# Patient Record
Sex: Male | Born: 1970 | Hispanic: No | Marital: Married | Smoking: Never smoker
Health system: Southern US, Community
[De-identification: ages and names within clinical notes are randomized; demographics above are authoritative.]

## PROBLEM LIST (undated history)

## (undated) DIAGNOSIS — T7840XA Allergy, unspecified, initial encounter: Secondary | ICD-10-CM

## (undated) DIAGNOSIS — S62509A Fracture of unspecified phalanx of unspecified thumb, initial encounter for closed fracture: Secondary | ICD-10-CM

## (undated) HISTORY — PX: TOOTH EXTRACTION: SUR596

## (undated) HISTORY — PX: COLONOSCOPY: SHX5424

## (undated) HISTORY — DX: Allergy, unspecified, initial encounter: T78.40XA

---

## 1998-03-10 ENCOUNTER — Emergency Department (HOSPITAL_COMMUNITY): Admission: EM | Admit: 1998-03-10 | Discharge: 1998-03-10 | Payer: Self-pay | Admitting: Emergency Medicine

## 1999-07-14 ENCOUNTER — Emergency Department (HOSPITAL_COMMUNITY): Admission: EM | Admit: 1999-07-14 | Discharge: 1999-07-14 | Payer: Self-pay | Admitting: Podiatry

## 2000-04-25 ENCOUNTER — Emergency Department (HOSPITAL_COMMUNITY): Admission: EM | Admit: 2000-04-25 | Discharge: 2000-04-25 | Payer: Self-pay | Admitting: Emergency Medicine

## 2002-07-07 ENCOUNTER — Ambulatory Visit (HOSPITAL_BASED_OUTPATIENT_CLINIC_OR_DEPARTMENT_OTHER): Admission: RE | Admit: 2002-07-07 | Discharge: 2002-07-07 | Payer: Self-pay | Admitting: General Surgery

## 2012-05-31 ENCOUNTER — Encounter: Payer: Self-pay | Admitting: Internal Medicine

## 2012-05-31 ENCOUNTER — Ambulatory Visit (INDEPENDENT_AMBULATORY_CARE_PROVIDER_SITE_OTHER): Payer: 59 | Admitting: Internal Medicine

## 2012-05-31 ENCOUNTER — Ambulatory Visit: Payer: Self-pay | Admitting: Internal Medicine

## 2012-05-31 VITALS — BP 110/74 | HR 86 | Temp 98.2°F | Resp 18 | Ht 71.25 in | Wt 198.0 lb

## 2012-05-31 DIAGNOSIS — Z0000 Encounter for general adult medical examination without abnormal findings: Secondary | ICD-10-CM

## 2012-05-31 NOTE — Progress Notes (Signed)
  Subjective:    Patient ID: Todd Pham, male    DOB: 02-15-1971, 41 y.o.   MRN: [id number]  HPI 41 year-old patient who is seen today to establish with our practice. He has enjoyed excellent health and has no concerns or complaints. He has a mild seasonal allergy issues but takes no chronic medications. Past medical history is unremarkable. No hospitalizations or illnesses. He takes no chronic medications. No surgeries. He did sustain a fracture to his arm in the past  Family history noncontributory pressure and there early 60s and in good health one brother one sister likewise in good health Social history nonsmoker up in [place] has been a resident of [place] since 1999. [employer] graduate in Primary school teacher    Review of Systems  Constitutional: Negative for fever, chills, activity change, appetite change and fatigue.  HENT: Negative for hearing loss, ear pain, congestion, rhinorrhea, sneezing, mouth sores, trouble swallowing, neck pain, neck stiffness, dental problem, voice change, sinus pressure and tinnitus.   Eyes: Negative for photophobia, pain, redness and visual disturbance.  Respiratory: Negative for apnea, cough, choking, chest tightness, shortness of breath and wheezing.   Cardiovascular: Negative for chest pain, palpitations and leg swelling.  Gastrointestinal: Negative for nausea, vomiting, abdominal pain, diarrhea, constipation, blood in stool, abdominal distention, anal bleeding and rectal pain.  Genitourinary: Negative for dysuria, urgency, frequency, hematuria, flank pain, decreased urine volume, discharge, penile swelling, scrotal swelling, difficulty urinating, genital sores and testicular pain.  Musculoskeletal: Negative for myalgias, back pain, joint swelling, arthralgias and gait problem.  Skin: Negative for color change, rash and wound.  Neurological: Negative for dizziness, tremors, seizures, syncope, facial asymmetry, speech difficulty, weakness, light-headedness,  numbness and headaches.  Hematological: Negative for adenopathy. Does not bruise/bleed easily.  Psychiatric/Behavioral: Negative for suicidal ideas, hallucinations, behavioral problems, confusion, disturbed wake/sleep cycle, self-injury, dysphoric mood, decreased concentration and agitation. The patient is not nervous/anxious.        Objective:   Physical Exam  Constitutional: He appears well-developed and well-nourished.  HENT:  Head: Normocephalic and atraumatic.  Right Ear: External ear normal.  Left Ear: External ear normal.  Nose: Nose normal.  Mouth/Throat: Oropharynx is clear and moist.  Eyes: Conjunctivae normal and EOM are normal. Pupils are equal, round, and reactive to light. No scleral icterus.  Neck: Normal range of motion. Neck supple. No JVD present. No thyromegaly present.  Cardiovascular: Regular rhythm, normal heart sounds and intact distal pulses.  Exam reveals no gallop and no friction rub.   No murmur heard. Pulmonary/Chest: Effort normal and breath sounds normal. He exhibits no tenderness.  Abdominal: Soft. Bowel sounds are normal. He exhibits no distension and no mass. There is no tenderness.  Genitourinary: Penis normal.  Musculoskeletal: Normal range of motion. He exhibits no edema and no tenderness.  Lymphadenopathy:    He has no cervical adenopathy.  Neurological: He is alert. He has normal reflexes. No cranial nerve deficit. Coordination normal.  Skin: Skin is warm and dry. No rash noted.  Psychiatric: He has a normal mood and affect. His behavior is normal.          Assessment & Plan:   Preventive health exam . More rigorous exercise regimen modest weight loss all encouraged  Return here in one or 2 years

## 2012-05-31 NOTE — Patient Instructions (Signed)
It is important that you exercise regularly, at least 20 minutes 3 to 4 times per week.  If you develop chest pain or shortness of breath seek  medical attention.   

## 2014-01-31 ENCOUNTER — Telehealth: Payer: Self-pay | Admitting: Internal Medicine

## 2014-01-31 NOTE — Telephone Encounter (Signed)
Dr. Raliegh Ip, please see message and advise whether we can schedule appointment.

## 2014-01-31 NOTE — Telephone Encounter (Signed)
Pt called saying he saw Dr Raliegh Ip back in 2013 we can find no record of this we are showing he cancel this  appt . He is very adamant about being seen here and wants an appointment with Dr. Raliegh Ip  We offered him someone else but he refuses saying he is a patient of Dr Raliegh Ip

## 2014-02-01 ENCOUNTER — Ambulatory Visit (INDEPENDENT_AMBULATORY_CARE_PROVIDER_SITE_OTHER): Payer: PRIVATE HEALTH INSURANCE | Admitting: Family Medicine

## 2014-02-01 ENCOUNTER — Encounter: Payer: Self-pay | Admitting: Family Medicine

## 2014-02-01 VITALS — BP 143/87 | HR 73 | Ht 71.0 in | Wt 204.0 lb

## 2014-02-01 DIAGNOSIS — IMO0001 Reserved for inherently not codable concepts without codable children: Secondary | ICD-10-CM

## 2014-02-01 DIAGNOSIS — Z8781 Personal history of (healed) traumatic fracture: Secondary | ICD-10-CM | POA: Insufficient documentation

## 2014-02-01 DIAGNOSIS — R030 Elevated blood-pressure reading, without diagnosis of hypertension: Secondary | ICD-10-CM

## 2014-02-01 DIAGNOSIS — Z13220 Encounter for screening for lipoid disorders: Secondary | ICD-10-CM

## 2014-02-01 DIAGNOSIS — Z0000 Encounter for general adult medical examination without abnormal findings: Secondary | ICD-10-CM

## 2014-02-01 DIAGNOSIS — Z131 Encounter for screening for diabetes mellitus: Secondary | ICD-10-CM

## 2014-02-01 LAB — POCT URINALYSIS DIPSTICK
Bilirubin, UA: NEGATIVE
Glucose, UA: NEGATIVE
Ketones, UA: NEGATIVE
Leukocytes, UA: NEGATIVE
Nitrite, UA: NEGATIVE
Protein, UA: NEGATIVE
RBC UA: NEGATIVE
Spec Grav, UA: 1.01
UROBILINOGEN UA: 0.2
pH, UA: 7

## 2014-02-01 NOTE — Progress Notes (Signed)
CC: Todd Pham is a 43 y.o. male is here for Establish Care   Subjective: HPI:  Colonoscopy: No family history of colon cancer will begin screening at age 68 Prostate: Discussed screening risks/beneifts with patient today, no family history of prostate cancer we'll consider screening age 37  Influenza Vaccine: No current indication however advised to get this during this flu season Pneumovax: No current indication Td/Tdap: Up to date with tetanus booster 2007/2008 Zoster: (Start 41 yo)  Very pleasant 43 year old here to establish care requesting CPE. His only complaint is occasional burning with urination. He has not been sexually active for 9 years. Burning is described only as burning, mild in severity, unpredictable, occasional cloudy haze to his urine. He denies penile discharge, penile lesions, nor testicular pain  Rare alcohol use no tobacco or recreational drug use.  Review of Systems - General ROS: negative for - chills, fever, night sweats, weight gain or weight loss Ophthalmic ROS: negative for - decreased vision Psychological ROS: negative for - anxiety or depression ENT ROS: negative for - hearing change, nasal congestion, tinnitus or allergies Hematological and Lymphatic ROS: negative for - bleeding problems, bruising or swollen lymph nodes Breast ROS: negative Respiratory ROS: no cough, shortness of breath, or wheezing Cardiovascular ROS: no chest pain or dyspnea on exertion Gastrointestinal ROS: no abdominal pain, change in bowel habits, or black or bloody stools Genito-Urinary ROS: negative for - genital discharge, genital ulcers, incontinence or abnormal bleeding from genitals Musculoskeletal ROS: negative for - joint pain or muscle pain Neurological ROS: negative for - headaches or memory loss Dermatological ROS: negative for lumps, mole changes, rash and skin lesion changes  Past Medical History  Diagnosis Date  . Allergy     History reviewed. No pertinent past  surgical history. Family History  Problem Relation Age of Onset  . Skin cancer      grandfather    History   Social History  . Marital Status: Single    Spouse Name: N/A    Number of Children: N/A  . Years of Education: N/A   Occupational History  . Not on file.   Social History Main Topics  . Smoking status: Never Smoker   . Smokeless tobacco: Never Used  . Alcohol Use: Yes  . Drug Use: No  . Sexual Activity: No   Other Topics Concern  . Not on file   Social History Narrative  . No narrative on file     Objective: BP 143/87  Pulse 73  Ht 5\' 11"  (1.803 m)  Wt 204 lb (92.534 kg)  BMI 28.46 kg/m2  General: No Acute Distress HEENT: Atraumatic, normocephalic, conjunctivae normal without scleral icterus.  No nasal discharge, hearing grossly intact, TMs with good landmarks bilaterally with no middle ear abnormalities, posterior pharynx clear without oral lesions. Neck: Supple, trachea midline, no cervical nor supraclavicular adenopathy. Pulmonary: Clear to auscultation bilaterally without wheezing, rhonchi, nor rales. Cardiac: Regular rate and rhythm.  No murmurs, rubs, nor gallops. No peripheral edema.  2+ peripheral pulses bilaterally. Abdomen: Bowel sounds normal.  No masses.  Non-tender without rebound.  Negative Murphy's sign. GU: Bilateral descended testes no inguinal hernia  MSK: Grossly intact, no signs of weakness.  Full strength throughout upper and lower extremities.  Full ROM in upper and lower extremities.  No midline spinal tenderness. He has approximately 5-10 of angulation in the left humerus deviating anteriorly approximately at the mid humeral shaft Neuro: Gait unremarkable, CN II-XII grossly intact.  C5-C6 Reflex 2/4 Bilaterally,  L4 Reflex 2/4 Bilaterally.  Cerebellar function intact. Skin: No rashes other than cherry angiomas on the forehead and back and chest Psych: Alert and oriented to person/place/time.  Thought process normal. No  anxiety/depression. Assessment & Plan: Todd Pham was seen today for establish care.  Diagnoses and associated orders for this visit:  Diabetes mellitus screening - Basic Metabolic Panel (BMET) - Urinalysis Dipstick  Elevated blood pressure - Basic Metabolic Panel (BMET)  Annual physical exam  Lipid screening - Lipid panel  History of humerus fracture    Offered routine STD panel most importantly GC and Chlamydia probe given his urinary symptoms but also blood work with HIV, syphilis, hepatitis C testing. He politely declines offer for to only provide a urinalysis to rule out UTI understanding this would not include gonorrhea or Chlamydia identification Healthy lifestyle interventions including but not limited to regular exercise, a healthy low fat diet, moderation of salt intake, the dangers of tobacco/alcohol/recreational drug use, nutrition supplementation, and accident avoidance were discussed with the patient and a handout was provided for future reference. Due for routine dyslipidemia and diabetic screening  Urinalysis was normal   Return in about 3 months (around 05/04/2014) for Blood Pressure Re-Check.

## 2014-02-01 NOTE — Patient Instructions (Signed)
Dr. Lajoyce Lauber General Advice Following Your Complete Physical Exam  The Benefits of Regular Exercise: Unless you suffer from an uncontrolled cardiovascular condition, studies strongly suggest that regular exercise and physical activity will add to both the quality and length of your life.  The World Health Organization recommends 150 minutes of moderate intensity aerobic activity every week.  This is best split over 3-4 days a week, and can be as simple as a brisk walk for just over 35 minutes "most days of the week".  This type of exercise has been shown to lower LDL-Cholesterol, lower average blood sugars, lower blood pressure, lower cardiovascular disease risk, improve memory, and increase one's overall sense of wellbeing.  The addition of anaerobic (or "strength training") exercises offers additional benefits including but not limited to increased metabolism, prevention of osteoporosis, and improved overall cholesterol levels.  How Can I Strive For A Low-Fat Diet?: Current guidelines recommend that 25-35 percent of your daily energy (food) intake should come from fats.  One might ask how can this be achieved without having to dissect each meal on a daily basis?  Switch to skim or 1% milk instead of whole milk.  Focus on lean meats such as ground [place], fresh fish, baked chicken, and lean cuts of beef as your source of dietary protein.  Limit saturated fat consumption to less than 10% of your daily caloric intake.  Limit trans fatty acid consumption primarily by limiting synthetic trans fats such as partially hydrogenated oils (Ex: fried fast foods).  Substitute olive or vegetable oil for solid fats where possible.  Moderation of Salt Intake: Provided you don't carry a diagnosis of congestive heart failure nor renal failure, I recommend a daily allowance of no more than 2300 mg of salt (sodium).  Keeping under this daily goal is associated with a decreased risk of cardiovascular events, creeping  above it can lead to elevated blood pressures and increases your risk of cardiovascular events.  Milligrams (mg) of salt is listed on all nutrition labels, and your daily intake can add up faster than you think.  Most canned and frozen dinners can pack in over half your daily salt allowance in one meal.    Lifestyle Health Risks: Certain lifestyle choices carry specific health risks.  As you may already know, tobacco use has been associated with increasing one's risk of cardiovascular disease, pulmonary disease, numerous cancers, among many other issues.  What you may not know is that there are medications and nicotine replacement strategies that can more than double your chances of successfully quitting.  I would be thrilled to help manage your quitting strategy if you currently use tobacco products.  When it comes to alcohol use, I've yet to find an "ideal" daily allowance.  Provided an individual does not have a medical condition that is exacerbated by alcohol consumption, general guidelines determine "safe drinking" as no more than two standard drinks for a man or no more than one standard drink for a male per day.  However, much debate still exists on whether any amount of alcohol consumption is technically "safe".  My general advice, keep alcohol consumption to a minimum for general health promotion.  If you or others believe that alcohol, tobacco, or recreational drug use is interfering with your life, I would be happy to provide confidential counseling regarding treatment options.  General "Over The Counter" Nutrition Advice: Postmenopausal women should aim for a daily calcium intake of 1200 mg, however a significant portion of this might already be  provided by diets including milk, yogurt, cheese, and other dairy products.  Vitamin D has been shown to help preserve bone density, prevent fatigue, and has even been shown to help reduce falls in the elderly.  Ensuring a daily intake of 800 Units of  Vitamin D is a good place to start to enjoy the above benefits, we can easily check your Vitamin D level to see if you'd potentially benefit from supplementation beyond 800 Units a day.  Folic Acid intake should be of particular concern to women of childbearing age.  Daily consumption of 007-622 mcg of Folic Acid is recommended to minimize the chance of spinal cord defects in a fetus should pregnancy occur.    For many adults, accidents still remain one of the most common culprits when it comes to cause of death.  Some of the simplest but most effective preventitive habits you can adopt include regular seatbelt use, proper helmet use, securing firearms, and regularly testing your smoke and carbon monoxide detectors.  Sean B. [place] [address] [place], [place], [place] [address] Phone: [phone]  Testicular Self-Exam A self-examination of your testicles involves looking at and feeling your testicles for abnormal lumps or swelling. Several things can cause swelling, lumps, or pain in your testicles. Some of these causes are:  Injuries.  Inflammation.  Infection.  Accumulation of fluids around your testicle (hydrocele).  Twisted testicles (testicular torsion).  Testicular cancer. Self-examination of the testicles and groin areas may be advised if you are at risk for testicular cancer. Risks for testicular cancer include:  An undescended testicle (cryptorchidism).  A history of previous testicular cancer.  A family history of testicular cancer. The testicles are easiest to examine after warm baths or showers and are more difficult to examine when you are cold. This is because the muscles attached to the testicles retract and pull them up higher or into the abdomen. Follow these steps while you are standing:  Hold your penis away from your body.  Roll one testicle between your thumb and forefinger, feeling the entire testicle.  Roll the other  testicle between your thumb and forefinger, feeling the entire testicle. Feel for lumps, swelling, or discomfort. A normal testicle is egg shaped and feels firm. It is smooth and not tender. The spermatic cord can be felt as a firm spaghetti-like cord at the back of your testicle. It is also important to examine the crease between the front of your leg and your abdomen. Feel for any bumps that are tender. These could be enlarged lymph nodes.  Document Released: 11/10/2000 Document Revised: 04/06/2013 Document Reviewed: 01/24/2013 [facility] Patient Information 2015 [place], [place]. This information is not intended to replace advice given to you by your health care Valisha Heslin. Make sure you discuss any questions you have with your health care Olivya Sobol.

## 2014-02-03 LAB — BASIC METABOLIC PANEL
BUN: 9 mg/dL (ref 6–23)
CO2: 27 mEq/L (ref 19–32)
CREATININE: 0.71 mg/dL (ref 0.50–1.35)
Calcium: 9.4 mg/dL (ref 8.4–10.5)
Chloride: 103 mEq/L (ref 96–112)
Glucose, Bld: 83 mg/dL (ref 70–99)
Potassium: 4.3 mEq/L (ref 3.5–5.3)
Sodium: 140 mEq/L (ref 135–145)

## 2014-02-03 LAB — LIPID PANEL
CHOLESTEROL: 165 mg/dL (ref 0–200)
HDL: 53 mg/dL (ref >39)
LDL Cholesterol: 97 mg/dL (ref 0–99)
TRIGLYCERIDES: 74 mg/dL (ref <150)
Total CHOL/HDL Ratio: 3.1 Ratio
VLDL: 15 mg/dL (ref 0–40)

## 2014-05-05 ENCOUNTER — Ambulatory Visit: Payer: PRIVATE HEALTH INSURANCE | Admitting: Family Medicine

## 2014-05-12 ENCOUNTER — Encounter: Payer: Self-pay | Admitting: Family Medicine

## 2014-05-12 ENCOUNTER — Ambulatory Visit (INDEPENDENT_AMBULATORY_CARE_PROVIDER_SITE_OTHER): Payer: PRIVATE HEALTH INSURANCE | Admitting: Family Medicine

## 2014-05-12 VITALS — BP 137/90 | HR 66 | Wt 206.0 lb

## 2014-05-12 DIAGNOSIS — M25562 Pain in left knee: Secondary | ICD-10-CM

## 2014-05-12 DIAGNOSIS — M25569 Pain in unspecified knee: Secondary | ICD-10-CM

## 2014-05-12 DIAGNOSIS — I10 Essential (primary) hypertension: Secondary | ICD-10-CM | POA: Insufficient documentation

## 2014-05-12 NOTE — Patient Instructions (Signed)
DASH Eating Plan °DASH stands for "Dietary Approaches to Stop Hypertension." The DASH eating plan is a healthy eating plan that has been shown to reduce high blood pressure (hypertension). Additional health benefits may include reducing the risk of type 2 diabetes mellitus, heart disease, and stroke. The DASH eating plan may also help with weight loss. °WHAT DO I NEED TO KNOW ABOUT THE DASH EATING PLAN? °For the DASH eating plan, you will follow these general guidelines: °· Choose foods with a percent daily value for sodium of less than 5% (as listed on the food label). °· Use salt-free seasonings or herbs instead of table salt or sea salt. °· Check with your health care provider or pharmacist before using salt substitutes. °· Eat lower-sodium products, often labeled as "lower sodium" or "no salt added." °· Eat fresh foods. °· Eat more vegetables, fruits, and low-fat dairy products. °· Choose whole grains. Look for the word "whole" as the first word in the ingredient list. °· Choose fish and skinless chicken or turkey more often than red meat. Limit fish, poultry, and meat to 6 oz (170 g) each day. °· Limit sweets, desserts, sugars, and sugary drinks. °· Choose heart-healthy fats. °· Limit cheese to 1 oz (28 g) per day. °· Eat more home-cooked food and less restaurant, buffet, and fast food. °· Limit fried foods. °· Cook foods using methods other than frying. °· Limit canned vegetables. If you do use them, rinse them well to decrease the sodium. °· When eating at a restaurant, ask that your food be prepared with less salt, or no salt if possible. °WHAT FOODS CAN I EAT? °Seek help from a dietitian for individual calorie needs. °Grains °Whole grain or whole wheat bread. Brown rice. Whole grain or whole wheat pasta. Quinoa, bulgur, and whole grain cereals. Low-sodium cereals. Corn or whole wheat flour tortillas. Whole grain cornbread. Whole grain crackers. Low-sodium crackers. °Vegetables °Fresh or frozen vegetables  (raw, steamed, roasted, or grilled). Low-sodium or reduced-sodium tomato and vegetable juices. Low-sodium or reduced-sodium tomato sauce and paste. Low-sodium or reduced-sodium canned vegetables.  °Fruits °All fresh, canned (in natural juice), or frozen fruits. °Meat and Other Protein Products °Ground beef (85% or leaner), grass-fed beef, or beef trimmed of fat. Skinless chicken or turkey. Ground chicken or turkey. Pork trimmed of fat. All fish and seafood. Eggs. Dried beans, peas, or lentils. Unsalted nuts and seeds. Unsalted canned beans. °Dairy °Low-fat dairy products, such as skim or 1% milk, 2% or reduced-fat cheeses, low-fat ricotta or cottage cheese, or plain low-fat yogurt. Low-sodium or reduced-sodium cheeses. °Fats and Oils °Tub margarines without trans fats. Light or reduced-fat mayonnaise and salad dressings (reduced sodium). Avocado. Safflower, olive, or canola oils. Natural peanut or almond butter. °Other °Unsalted popcorn and pretzels. °The items listed above may not be a complete list of recommended foods or beverages. Contact your dietitian for more options. °WHAT FOODS ARE NOT RECOMMENDED? °Grains °White bread. White pasta. White rice. Refined cornbread. Bagels and croissants. Crackers that contain trans fat. °Vegetables °Creamed or fried vegetables. Vegetables in a cheese sauce. Regular canned vegetables. Regular canned tomato sauce and paste. Regular tomato and vegetable juices. °Fruits °Dried fruits. Canned fruit in light or heavy syrup. Fruit juice. °Meat and Other Protein Products °Fatty cuts of meat. Ribs, chicken wings, bacon, sausage, bologna, salami, chitterlings, fatback, hot dogs, bratwurst, and packaged luncheon meats. Salted nuts and seeds. Canned beans with salt. °Dairy °Whole or 2% milk, cream, half-and-half, and cream cheese. Whole-fat or sweetened yogurt. Full-fat   cheeses or blue cheese. Nondairy creamers and whipped toppings. Processed cheese, cheese spreads, or cheese  curds. °Condiments °Onion and garlic salt, seasoned salt, table salt, and sea salt. Canned and packaged gravies. Worcestershire sauce. Tartar sauce. Barbecue sauce. Teriyaki sauce. Soy sauce, including reduced sodium. Steak sauce. Fish sauce. Oyster sauce. Cocktail sauce. Horseradish. Ketchup and mustard. Meat flavorings and tenderizers. Bouillon cubes. Hot sauce. Tabasco sauce. Marinades. Taco seasonings. Relishes. °Fats and Oils °Butter, stick margarine, lard, shortening, ghee, and bacon fat. Coconut, palm kernel, or palm oils. Regular salad dressings. °Other °Pickles and olives. Salted popcorn and pretzels. °The items listed above may not be a complete list of foods and beverages to avoid. Contact your dietitian for more information. °WHERE CAN I FIND MORE INFORMATION? °National Heart, Lung, and [employer] Institute: www.nhlbi.nih.gov/health/health-topics/topics/dash/ °Document Released: 07/24/2011 Document Revised: 12/19/2013 Document Reviewed: 06/08/2013 °[employer]® Patient Information ©2015 [employer]. This information is not intended to replace advice given to you by your health care provider. Make sure you discuss any questions you have with your health care provider. ° °

## 2014-05-12 NOTE — Progress Notes (Signed)
CC: Todd Pham is a 43 y.o. male is here for Hypertension   Subjective: HPI:  Complaint of left knee pain that has been present off and on for matter of months. It seems to be worse after mountain biking  Or any strenuous activity. It is localized deep in the joint and nonradiating. It is worse after activity and only mild during activity. Is described only as a pain. He denies any recent or remote catching locking or giving way nor swelling or overlying skin changes. He denies any recent or remote trauma. No interventions as of yet  Followup essential hypertension: Exercising approximately once a week, tries to be cognizant of sodium intake. Does not add salt any food. Shortness of breath orthopnea peripheral edema nor motor or sensory disturbances. No outside blood pressures to report  Review Of Systems Outlined In HPI  Past Medical History  Diagnosis Date  . Allergy     No past surgical history on file. Family History  Problem Relation Age of Onset  . Skin cancer      grandfather    History   Social History  . Marital Status: Single    Spouse Name: N/A    Number of Children: N/A  . Years of Education: N/A   Occupational History  . Not on file.   Social History Main Topics  . Smoking status: Never Smoker   . Smokeless tobacco: Never Used  . Alcohol Use: Yes  . Drug Use: No  . Sexual Activity: No   Other Topics Concern  . Not on file   Social History Narrative  . No narrative on file     Objective: BP 137/90  Pulse 66  Wt 206 lb (93.441 kg)  General: Alert and Oriented, No Acute Distress HEENT: Pupils equal, round, reactive to light. Conjunctivae clear.  Moist membranes pharynx unremarkable Lungs: Clear to auscultation bilaterally, no wheezing/ronchi/rales.  Comfortable work of breathing. Good air movement. Cardiac: Regular rate and rhythm. Normal S1/S2.  No murmurs, rubs, nor gallops.   Abdomen: Normal bowel sounds, soft and non tender without palpable  masses. Left knee exam shows full-strength and range of motion. There is no swelling, redness, nor warmth overlying the knee.  No patellar crepitus. No patellar apprehension. No pain with palpation of the inferior patellar pole.  No pain or laxity with valgus nor varus stress. Anterior drawer is negative. McMurray's negative. No popliteal space tenderness or palpable mass. No medial or lateral joint line tenderness to palpation.Extremities: No peripheral edema.  Strong peripheral pulses.  Mental Status: No depression, anxiety, nor agitation. Skin: Warm and dry.  Assessment & Plan: Todd Pham was seen today for hypertension.  Diagnoses and associated orders for this visit:  Left knee pain  Elevated blood pressure    Left knee pain: Suspect osteoarthritis. He was given a handout for home rehabilitation along with a resistance band to be performed on a daily basis for the next 3-4 weeks. If no benefit afterwards please followup with Dr. Darene Lamer. for sports medicine consult Essential hypertension: Formal diagnosis given today, uncontrolled I encouraged him to start on a blood pressure lowering medication however he politely declines. Discussed that This can be improved with engaging in 30-45 minutes of moderate exercise most days of the week. Discussed the Dash diet and provided with a handout   Return in about 3 months (around 08/11/2014) for BP.

## 2014-11-30 ENCOUNTER — Encounter: Payer: Self-pay | Admitting: Family Medicine

## 2014-11-30 ENCOUNTER — Ambulatory Visit (INDEPENDENT_AMBULATORY_CARE_PROVIDER_SITE_OTHER): Payer: PRIVATE HEALTH INSURANCE | Admitting: Family Medicine

## 2014-11-30 VITALS — BP 135/82 | HR 70 | Ht 71.0 in | Wt 205.0 lb

## 2014-11-30 DIAGNOSIS — M5030 Other cervical disc degeneration, unspecified cervical region: Secondary | ICD-10-CM | POA: Diagnosis not present

## 2014-11-30 DIAGNOSIS — I10 Essential (primary) hypertension: Secondary | ICD-10-CM | POA: Diagnosis not present

## 2014-11-30 DIAGNOSIS — R0982 Postnasal drip: Secondary | ICD-10-CM | POA: Diagnosis not present

## 2014-11-30 MED ORDER — PREDNISONE 20 MG PO TABS: ORAL_TABLET | ORAL | Status: AC

## 2014-11-30 NOTE — Progress Notes (Signed)
CC: Todd Pham is a 44 y.o. male is here for Follow-up   Subjective: HPI:  Follow-up essential hypertension: No outside blood pressures report. He is trying to stay physically active with mountain biking. [place] keep sodium intake to a minimum. No chest pain shortness of breath orthopnea peripheral edema nor motor or sensory disturbances other than that described below.  Since I saw him last he was seen at a orthopedic clinic and was found to have degenerative disc disease in the cervical spine. They have proposed that he get treatment with some sort of table that he would lie on that vibrates to get "fluid back into the discs". They've also proposed that he gets steroid injections in the cervical spine. He tells me he has no neck pain but does have left shoulder and upper proximal extremity discomfort that he has difficulty describing but has no positional component to the pain and no overlying skin changes at the site of pain. He is uncertain how long this pain has been there but it comes and goes throughout the day mild in severity. Denies any weakness or paresthesia in either upper extremities  His major complaint today is an accumulation of mucus in the back of his throat that has been present on a daily basis for many years. He gets the sense of anxiety that something might choke him. He denies any choking or dysphagia. No interventions as of yet. He denies any reflux or difficulty breathing. Denies nasal congestion or sore throat.  Review Of Systems Outlined In HPI  Past Medical History  Diagnosis Date  . Allergy     No past surgical history on file. Family History  Problem Relation Age of Onset  . Skin cancer      grandfather    History   Social History  . Marital Status: Single    Spouse Name: N/A  . Number of Children: N/A  . Years of Education: N/A   Occupational History  . Not on file.   Social History Main Topics  . Smoking status: Never Smoker   . Smokeless  tobacco: Never Used  . Alcohol Use: Yes  . Drug Use: No  . Sexual Activity: No   Other Topics Concern  . Not on file   Social History Narrative     Objective: BP 135/82 mmHg  Pulse 70  Ht 5\' 11"  (1.803 m)  Wt 205 lb (92.987 kg)  BMI 28.60 kg/m2  General: Alert and Oriented, No Acute Distress HEENT: Pupils equal, round, reactive to light. Conjunctivae clear.  External ears unremarkable, canals clear with intact TMs with appropriate landmarks.  Middle ear appears open without effusion. Pink inferior turbinates.  Moist mucous membranes, pharynx without inflammation nor lesions.  Neck supple without palpable lymphadenopathy nor abnormal masses. Lungs: Clear to auscultation bilaterally, no wheezing/ronchi/rales.  Comfortable work of breathing. Good air movement. Extremities: No peripheral edema.  Strong peripheral pulses.  Mental Status: No depression, anxiety, nor agitation. Skin: Warm and dry.  Assessment & Plan: Todd Pham was seen today for follow-up.  Diagnoses and all orders for this visit:  Essential hypertension, benign  Degenerative disc disease, cervical Orders: -     predniSONE (DELTASONE) 20 MG tablet; Three tabs at once daily for five days.  Postnasal drip   Essential hypertension: Controlled continue diet and exercise interventions Degenerative disc disease: Advised him to avoid taking steroid injections at this time since I'm not convinced that he is in any pain that is caused by his spine or  nerve impingement. There is a chance that his left shoulder and arm pain could be from nerve impingement therefore prednisone burst and if this is improved but not fully relieved then there is a good argument for going through with the cortisone injections. Advised him that the table procedure he is talking about sounds like a scam. Postnasal drip contributing to mucus in the back of the throat, discussed over-the-counter antihistamines to use at his discretion since this  accumulation is not going to affect his overall health and is benign.  Return if symptoms worsen or fail to improve.

## 2014-11-30 NOTE — Patient Instructions (Signed)
Medications to help reduce mucus production: Generic Zyrtec, Claritin, or Allegra.  All over the counter.

## 2014-12-01 ENCOUNTER — Ambulatory Visit: Payer: PRIVATE HEALTH INSURANCE | Admitting: Family Medicine

## 2015-01-09 ENCOUNTER — Encounter: Payer: PRIVATE HEALTH INSURANCE | Admitting: Family Medicine

## 2015-01-10 ENCOUNTER — Ambulatory Visit (INDEPENDENT_AMBULATORY_CARE_PROVIDER_SITE_OTHER): Payer: PRIVATE HEALTH INSURANCE | Admitting: Family Medicine

## 2015-01-10 ENCOUNTER — Encounter: Payer: Self-pay | Admitting: Family Medicine

## 2015-01-10 VITALS — BP 138/91 | HR 66 | Ht 71.0 in | Wt 209.0 lb

## 2015-01-10 DIAGNOSIS — L989 Disorder of the skin and subcutaneous tissue, unspecified: Secondary | ICD-10-CM

## 2015-01-10 DIAGNOSIS — Z0000 Encounter for general adult medical examination without abnormal findings: Secondary | ICD-10-CM

## 2015-01-10 NOTE — Progress Notes (Signed)
CC: Todd Pham is a 44 y.o. male is here for Annual Exam   Subjective: HPI:  Colonoscopy: No family history of colon cancer will begin screening at age 9 Prostate: Discussed screening risks/beneifts with patient on today, we'll begin screening at age 54   Influenza Vaccine: No current indication Pneumovax: No current indication Td/Tdap: Td 2008 UTD Zoster: (Start 44 yo)  Requesting complete physical exam with his only complaint being left elbow pain. It's worse with lifting weights or when he puts his arm behind his back. No interventions as of yet.  Review of Systems - General ROS: negative for - chills, fever, night sweats, weight gain or weight loss Ophthalmic ROS: negative for - decreased vision Psychological ROS: negative for - anxiety or depression ENT ROS: negative for - hearing change, nasal congestion, tinnitus or allergies Hematological and Lymphatic ROS: negative for - bleeding problems, bruising or swollen lymph nodes Breast ROS: negative Respiratory ROS: no cough, shortness of breath, or wheezing Cardiovascular ROS: no chest pain or dyspnea on exertion Gastrointestinal ROS: no abdominal pain, change in bowel habits, or black or bloody stools Genito-Urinary ROS: negative for - genital discharge, genital ulcers, incontinence or abnormal bleeding from genitals Musculoskeletal ROS: negative for - joint pain or muscle pain other than that surrounded above Neurological ROS: negative for - headaches or memory loss Dermatological ROS: negative for lumps, mole changes, rash and skin lesion changes  Past Medical History  Diagnosis Date  . Allergy     No past surgical history on file. Family History  Problem Relation Age of Onset  . Skin cancer      grandfather    History   Social History  . Marital Status: Single    Spouse Name: N/A  . Number of Children: N/A  . Years of Education: N/A   Occupational History  . Not on file.   Social History Main Topics  .  Smoking status: Never Smoker   . Smokeless tobacco: Never Used  . Alcohol Use: Yes  . Drug Use: No  . Sexual Activity: No   Other Topics Concern  . Not on file   Social History Narrative     Objective: BP 138/91 mmHg  Pulse 66  Ht 5\' 11"  (1.803 m)  Wt 209 lb (94.802 kg)  BMI 29.16 kg/m2  General: No Acute Distress HEENT: Atraumatic, normocephalic, conjunctivae normal without scleral icterus.  No nasal discharge, hearing grossly intact, TMs with good landmarks bilaterally with no middle ear abnormalities, posterior pharynx clear without oral lesions. Neck: Supple, trachea midline, no cervical nor supraclavicular adenopathy. Pulmonary: Clear to auscultation bilaterally without wheezing, rhonchi, nor rales. Cardiac: Regular rate and rhythm.  No murmurs, rubs, nor gallops. No peripheral edema.  2+ peripheral pulses bilaterally. Abdomen: Bowel sounds normal.  No masses.  Non-tender without rebound.  Negative Murphy's sign. MSK: Grossly intact, no signs of weakness.  Full strength throughout upper and lower extremities.  Full ROM in upper and lower extremities.  No midline spinal tenderness. Left elbow pain reproduced when elbow is in extension and wrist extension is resisted. Pain localized to just medial of lateral epicondyle. Neuro: Gait unremarkable, CN II-XII grossly intact.  C5-C6 Reflex 2/4 Bilaterally, L4 Reflex 2/4 Bilaterally.  Cerebellar function intact. Skin: No rashes. Red raised lesion behind the left ear with telangiectasia concerning for basal cell carcinoma Psych: Alert and oriented to person/place/time.  Thought process normal. No anxiety/depression.  Assessment & Plan: Todd Pham was seen today for annual exam.  Diagnoses and all  orders for this visit:  Annual physical exam Orders: -     Lipid panel -     COMPLETE METABOLIC PANEL WITH GFR -     CBC  Facial lesion Orders: -     Ambulatory referral to Dermatology   Healthy lifestyle interventions including but not  limited to regular exercise, a healthy low fat diet, moderation of salt intake, the dangers of tobacco/alcohol/recreational drug use, nutrition supplementation, and accident avoidance were discussed with the patient and a handout was provided for future reference.  Return in about 3 months (around 04/12/2015) for Blood pressure.  Referral to dermatology for evaluation of skin lesion behind left ear.  Home rehabilitation plan for left tennis elbow.

## 2015-01-24 LAB — CBC
HEMATOCRIT: 44.4 % (ref 39.0–52.0)
Hemoglobin: 15.2 g/dL (ref 13.0–17.0)
MCH: 30.4 pg (ref 26.0–34.0)
MCHC: 34.2 g/dL (ref 30.0–36.0)
MCV: 88.8 fL (ref 78.0–100.0)
MPV: 9.8 fL (ref 8.6–12.4)
Platelets: 321 10*3/uL (ref 150–400)
RBC: 5 MIL/uL (ref 4.22–5.81)
RDW: 13.7 % (ref 11.5–15.5)
WBC: 5.4 10*3/uL (ref 4.0–10.5)

## 2015-01-24 LAB — LIPID PANEL
CHOL/HDL RATIO: 3.5 ratio
CHOLESTEROL: 168 mg/dL (ref 0–200)
HDL: 48 mg/dL (ref >=40)
LDL Cholesterol: 107 mg/dL — ABNORMAL HIGH (ref 0–99)
TRIGLYCERIDES: 66 mg/dL (ref <150)
VLDL: 13 mg/dL (ref 0–40)

## 2015-01-24 LAB — COMPLETE METABOLIC PANEL WITH GFR
ALT: 22 U/L (ref 0–53)
AST: 18 U/L (ref 0–37)
Albumin: 4.4 g/dL (ref 3.5–5.2)
Alkaline Phosphatase: 69 U/L (ref 39–117)
BILIRUBIN TOTAL: 0.5 mg/dL (ref 0.2–1.2)
BUN: 15 mg/dL (ref 6–23)
CHLORIDE: 105 meq/L (ref 96–112)
CO2: 27 mEq/L (ref 19–32)
Calcium: 9.6 mg/dL (ref 8.4–10.5)
Creat: 0.77 mg/dL (ref 0.50–1.35)
GFR, Est African American: >89 mL/min
Glucose, Bld: 95 mg/dL (ref 70–99)
Potassium: 4.5 mEq/L (ref 3.5–5.3)
SODIUM: 140 meq/L (ref 135–145)
Total Protein: 7.2 g/dL (ref 6.0–8.3)

## 2015-04-12 ENCOUNTER — Ambulatory Visit: Payer: PRIVATE HEALTH INSURANCE | Admitting: Family Medicine

## 2015-04-13 ENCOUNTER — Encounter: Payer: Self-pay | Admitting: Family Medicine

## 2015-04-13 ENCOUNTER — Ambulatory Visit (INDEPENDENT_AMBULATORY_CARE_PROVIDER_SITE_OTHER): Payer: PRIVATE HEALTH INSURANCE | Admitting: Family Medicine

## 2015-04-13 VITALS — BP 130/86 | HR 58 | Ht 71.0 in | Wt 207.0 lb

## 2015-04-13 DIAGNOSIS — M25522 Pain in left elbow: Secondary | ICD-10-CM

## 2015-04-13 DIAGNOSIS — I10 Essential (primary) hypertension: Secondary | ICD-10-CM

## 2015-04-13 NOTE — Progress Notes (Signed)
CC: Todd Pham is a 44 y.o. male is here for Follow-up   Subjective: HPI:  Follow-up essential hypertension: Currently not taking any antihypertensives. No outside blood pressure to report. No chest pain shortness of breath orthopnea nor peripheral edema. He is exercising by riding a bicycle some days out of the week.  Complains of left elbow pain that slightly improved with doing a few days of the exercises for lateral epicondylitis I gave him at the last visit. He tells me to know longer on the lateral aspect of the elbow and now on the volar surface of the elbow deep in the elbow. It is present only when doing bicep curls or lifting something using his bicep. Pain is nonradiating. It is sharp. He denies any swelling redness or warmth of the elbow. Denies any recent remote trauma. No pain with riding bicycles   Review Of Systems Outlined In HPI  Past Medical History  Diagnosis Date  . Allergy     No past surgical history on file. Family History  Problem Relation Age of Onset  . Skin cancer      grandfather    Social History   Social History  . Marital Status: Single    Spouse Name: N/A  . Number of Children: N/A  . Years of Education: N/A   Occupational History  . Not on file.   Social History Main Topics  . Smoking status: Never Smoker   . Smokeless tobacco: Never Used  . Alcohol Use: Yes  . Drug Use: No  . Sexual Activity: No   Other Topics Concern  . Not on file   Social History Narrative     Objective: BP 130/86 mmHg  Pulse 58  Ht 5\' 11"  (1.803 m)  Wt 207 lb (93.895 kg)  BMI 28.88 kg/m2  General: Alert and Oriented, No Acute Distress HEENT: Pupils equal, round, reactive to light. Conjunctivae clear.  Moist mucous membranes Lungs: Clear to auscultation bilaterally, no wheezing/ronchi/rales.  Comfortable work of breathing. Good air movement. Cardiac: Regular rate and rhythm. Normal S1/S2.  No murmurs, rubs, nor gallops.   Left elbow exam: Full range of  motion and strength. No pain at the medial or lateral epicondyle. Pain is reproduced with resisted wrist supination Extremities: No peripheral edema.  Strong peripheral pulses.  Mental Status: No depression, anxiety, nor agitation. Skin: Warm and dry.  Assessment & Plan: Todd Pham was seen today for follow-up.  Diagnoses and all orders for this visit:  Essential hypertension, benign  Left elbow pain   Essential hypertension: Controlled with exercise and sodium restriction. Left elbow pain: Lower suspicion for lateral epicondylitis, suspect this is coming from a bicipital tendinitis therefore he was given home exercises focusing on rehabilitation of this muscle and tendon.  Return in about 6 months (around 10/14/2015).

## 2015-10-15 ENCOUNTER — Ambulatory Visit (INDEPENDENT_AMBULATORY_CARE_PROVIDER_SITE_OTHER): Payer: PRIVATE HEALTH INSURANCE | Admitting: Family Medicine

## 2015-10-15 ENCOUNTER — Encounter: Payer: Self-pay | Admitting: Family Medicine

## 2015-10-15 VITALS — BP 135/85 | HR 66 | Wt 211.0 lb

## 2015-10-15 DIAGNOSIS — I10 Essential (primary) hypertension: Secondary | ICD-10-CM | POA: Diagnosis not present

## 2015-10-15 NOTE — Progress Notes (Signed)
CC: Todd Pham is a 45 y.o. male is here for Hypertension   Subjective: HPI:  HTN: No longer exercising.  No chest pain shortness of breath orthopnea nor peripheral edema. No outside blood pressures report. Has not been watching sodium intake.    Review Of Systems Outlined In HPI  Past Medical History  Diagnosis Date  . Allergy     No past surgical history on file. Family History  Problem Relation Age of Onset  . Skin cancer      grandfather    Social History   Social History  . Marital Status: Single    Spouse Name: N/A  . Number of Children: N/A  . Years of Education: N/A   Occupational History  . Not on file.   Social History Main Topics  . Smoking status: Never Smoker   . Smokeless tobacco: Never Used  . Alcohol Use: Yes  . Drug Use: No  . Sexual Activity: No   Other Topics Concern  . Not on file   Social History Narrative     Objective: BP 135/85 mmHg  Pulse 66  Wt 211 lb (95.709 kg)  Vital signs reviewed. General: Alert and Oriented, No Acute Distress HEENT: Pupils equal, round, reactive to light. Conjunctivae clear.  External ears unremarkable.  Moist mucous membranes. Lungs: Clear and comfortable work of breathing, speaking in full sentences without accessory muscle use. Cardiac: Regular rate and rhythm.  Neuro: CN II-XII grossly intact, gait normal. Extremities: No peripheral edema.  Strong peripheral pulses.  Mental Status: No depression, anxiety, nor agitation. Logical though process. Skin: Warm and dry.  Assessment & Plan: Todd Pham was seen today for hypertension.  Diagnoses and all orders for this visit:  Essential hypertension, benign   Essential hypertension: Borderline elevated but not high enough to require blood pressure medication. Encouraged to follow the DASH diet and return to exercising most days out of the week. Follow up in 3-6 months  Return in about 3 months (around 01/17/2016).

## 2016-01-17 ENCOUNTER — Ambulatory Visit: Payer: PRIVATE HEALTH INSURANCE | Admitting: Family Medicine

## 2016-01-18 ENCOUNTER — Encounter: Payer: Self-pay | Admitting: Family Medicine

## 2016-01-18 ENCOUNTER — Ambulatory Visit (INDEPENDENT_AMBULATORY_CARE_PROVIDER_SITE_OTHER): Payer: PRIVATE HEALTH INSURANCE | Admitting: Family Medicine

## 2016-01-18 VITALS — BP 123/86 | HR 69 | Wt 204.0 lb

## 2016-01-18 DIAGNOSIS — Z0000 Encounter for general adult medical examination without abnormal findings: Secondary | ICD-10-CM | POA: Diagnosis not present

## 2016-01-18 LAB — COMPLETE METABOLIC PANEL WITH GFR
ALBUMIN: 4.3 g/dL (ref 3.6–5.1)
ALK PHOS: 75 U/L (ref 40–115)
ALT: 18 U/L (ref 9–46)
AST: 15 U/L (ref 10–40)
BUN: 11 mg/dL (ref 7–25)
CALCIUM: 9.1 mg/dL (ref 8.6–10.3)
CO2: 27 mmol/L (ref 20–31)
Chloride: 103 mmol/L (ref 98–110)
Creat: 0.79 mg/dL (ref 0.60–1.35)
GFR, Est African American: >89 mL/min (ref >=60)
GFR, Est Non African American: >89 mL/min (ref >=60)
Glucose, Bld: 94 mg/dL (ref 65–99)
POTASSIUM: 4.2 mmol/L (ref 3.5–5.3)
Sodium: 141 mmol/L (ref 135–146)
Total Bilirubin: 0.5 mg/dL (ref 0.2–1.2)
Total Protein: 7.2 g/dL (ref 6.1–8.1)

## 2016-01-18 LAB — LIPID PANEL
Cholesterol: 152 mg/dL (ref 125–200)
HDL: 54 mg/dL (ref >=40)
LDL Cholesterol: 83 mg/dL (ref <130)
TRIGLYCERIDES: 75 mg/dL (ref <150)
Total CHOL/HDL Ratio: 2.8 Ratio (ref <=5.0)
VLDL: 15 mg/dL (ref <30)

## 2016-01-18 LAB — CBC
HEMATOCRIT: 44.8 % (ref 38.5–50.0)
Hemoglobin: 15.3 g/dL (ref 13.2–17.1)
MCH: 30.2 pg (ref 27.0–33.0)
MCHC: 34.2 g/dL (ref 32.0–36.0)
MCV: 88.4 fL (ref 80.0–100.0)
MPV: 10 fL (ref 7.5–12.5)
Platelets: 332 10*3/uL (ref 140–400)
RBC: 5.07 MIL/uL (ref 4.20–5.80)
RDW: 13.6 % (ref 11.0–15.0)
WBC: 6.2 10*3/uL (ref 3.8–10.8)

## 2016-01-18 MED ORDER — DIPHENHYDRAMINE HCL 50 MG PO TABS: 50.0000 mg | ORAL_TABLET | Freq: Every evening | ORAL | Status: DC | PRN
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
## 2016-02-23 ENCOUNTER — Encounter (HOSPITAL_COMMUNITY): Payer: Self-pay | Admitting: Emergency Medicine

## 2016-02-23 ENCOUNTER — Emergency Department (HOSPITAL_COMMUNITY)
Admission: EM | Admit: 2016-02-23 | Discharge: 2016-02-23 | Disposition: A | Discharge status: Home / Self Care | Payer: PRIVATE HEALTH INSURANCE | Attending: Emergency Medicine | Admitting: Emergency Medicine

## 2016-02-23 ENCOUNTER — Emergency Department (HOSPITAL_COMMUNITY): Payer: PRIVATE HEALTH INSURANCE

## 2016-02-23 DIAGNOSIS — S61213A Laceration without foreign body of left middle finger without damage to nail, initial encounter: Secondary | ICD-10-CM | POA: Insufficient documentation

## 2016-02-23 DIAGNOSIS — S50312A Abrasion of left elbow, initial encounter: Secondary | ICD-10-CM | POA: Diagnosis not present

## 2016-02-23 DIAGNOSIS — S60512A Abrasion of left hand, initial encounter: Secondary | ICD-10-CM | POA: Insufficient documentation

## 2016-02-23 DIAGNOSIS — Y9389 Activity, other specified: Secondary | ICD-10-CM | POA: Insufficient documentation

## 2016-02-23 DIAGNOSIS — S62501A Fracture of unspecified phalanx of right thumb, initial encounter for closed fracture: Secondary | ICD-10-CM

## 2016-02-23 DIAGNOSIS — Y92410 Unspecified street and highway as the place of occurrence of the external cause: Secondary | ICD-10-CM | POA: Insufficient documentation

## 2016-02-23 DIAGNOSIS — Z79899 Other long term (current) drug therapy: Secondary | ICD-10-CM | POA: Insufficient documentation

## 2016-02-23 DIAGNOSIS — M79644 Pain in right finger(s): Secondary | ICD-10-CM | POA: Diagnosis present

## 2016-02-23 DIAGNOSIS — S60511A Abrasion of right hand, initial encounter: Secondary | ICD-10-CM | POA: Diagnosis not present

## 2016-02-23 DIAGNOSIS — Y999 Unspecified external cause status: Secondary | ICD-10-CM | POA: Diagnosis not present

## 2016-02-23 DIAGNOSIS — IMO0002 Reserved for concepts with insufficient information to code with codable children: Secondary | ICD-10-CM

## 2016-02-23 DIAGNOSIS — W19XXXA Unspecified fall, initial encounter: Secondary | ICD-10-CM

## 2016-02-23 DIAGNOSIS — S80211A Abrasion, right knee, initial encounter: Secondary | ICD-10-CM | POA: Insufficient documentation

## 2016-02-23 MED ORDER — BACITRACIN ZINC 500 UNIT/GM EX OINT
TOPICAL_OINTMENT | Freq: Two times a day (BID) | CUTANEOUS | Status: DC
  Administered 2016-02-23: 23:00:00 via TOPICAL
  Filled 2016-02-23: qty 49.5

## 2016-02-23 MED ORDER — HYDROCODONE-ACETAMINOPHEN 5-325 MG PO TABS
2.0000 | ORAL_TABLET | Freq: Once | ORAL | Status: AC
  Administered 2016-02-23: 2 via ORAL
  Filled 2016-02-23: qty 2

## 2016-02-23 MED ORDER — TETANUS-DIPHTH-ACELL PERTUSSIS 5-2.5-18.5 LF-MCG/0.5 IM SUSP
0.5000 mL | Freq: Once | INTRAMUSCULAR | Status: AC
  Administered 2016-02-23: 0.5 mL via INTRAMUSCULAR
  Filled 2016-02-23: qty 0.5

## 2016-02-23 MED ORDER — SULFAMETHOXAZOLE-TRIMETHOPRIM 800-160 MG PO TABS: 1.0000 | ORAL_TABLET | Freq: Two times a day (BID) | ORAL | Status: AC

## 2016-02-23 MED ORDER — LIDOCAINE HCL 2 % IJ SOLN
10.0000 mL | Freq: Once | INTRAMUSCULAR | Status: AC
  Administered 2016-02-23: 200 mg via INTRADERMAL
  Filled 2016-02-23: qty 20

## 2016-02-23 MED ORDER — HYDROCODONE-ACETAMINOPHEN 5-325 MG PO TABS: 1.0000 | ORAL_TABLET | Freq: Four times a day (QID) | ORAL | Status: DC | PRN
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
## 2016-02-23 NOTE — Discharge Instructions (Signed)
[name],  Nice meeting you! Please follow-up with Dr. Fredna Dow. Return to the emergency department if you develop fevers, chills, inability to move your hands, new/worsening symptoms, loss of bowel or bladder control. Feel better soon!  S. Wendie Simmer, PA-C   Cast or Splint Care Casts and splints support injured limbs and keep bones from moving while they heal. It is important to care for your cast or splint at home.  HOME CARE INSTRUCTIONS  Keep the cast or splint uncovered during the drying period. It can take 24 to 48 hours to dry if it is made of plaster. A fiberglass cast will dry in less than 1 hour.  Do not rest the cast on anything harder than a pillow for the first 24 hours.  Do not put weight on your injured limb or apply pressure to the cast until your health care provider gives you permission.  Keep the cast or splint dry. Wet casts or splints can lose their shape and may not support the limb as well. A wet cast that has lost its shape can also create harmful pressure on your skin when it dries. Also, wet skin can become infected.  Cover the cast or splint with a plastic bag when bathing or when out in the rain or snow. If the cast is on the trunk of the body, take sponge baths until the cast is removed.  If your cast does become wet, dry it with a towel or a blow dryer on the cool setting only.  Keep your cast or splint clean. Soiled casts may be wiped with a moistened cloth.  Do not place any hard or soft foreign objects under your cast or splint, such as cotton, toilet paper, lotion, or powder.  Do not try to scratch the skin under the cast with any object. The object could get stuck inside the cast. Also, scratching could lead to an infection. If itching is a problem, use a blow dryer on a cool setting to relieve discomfort.  Do not trim or cut your cast or remove padding from inside of it.  Exercise all joints next to the injury that are not immobilized by the  cast or splint. For example, if you have a long leg cast, exercise the hip joint and toes. If you have an arm cast or splint, exercise the shoulder, elbow, thumb, and fingers.  Elevate your injured arm or leg on 1 or 2 pillows for the first 1 to 3 days to decrease swelling and pain.It is best if you can comfortably elevate your cast so it is higher than your heart. SEEK MEDICAL CARE IF:   Your cast or splint cracks.  Your cast or splint is too tight or too loose.  You have unbearable itching inside the cast.  Your cast becomes wet or develops a soft spot or area.  You have a bad smell coming from inside your cast.  You get an object stuck under your cast.  Your skin around the cast becomes red or raw.  You have new pain or worsening pain after the cast has been applied. SEEK IMMEDIATE MEDICAL CARE IF:   You have fluid leaking through the cast.  You are unable to move your fingers or toes.  You have discolored (blue or white), cool, painful, or very swollen fingers or toes beyond the cast.  You have tingling or numbness around the injured area.  You have severe pain or pressure under the cast.  You have  any difficulty with your breathing or have shortness of breath.  You have chest pain.   This information is not intended to replace advice given to you by your health care provider. Make sure you discuss any questions you have with your health care provider.   Document Released: 08/01/2000 Document Revised: 05/25/2013 Document Reviewed: 02/10/2013 Elsevier Interactive Patient Education 2016 [employer]  Finger Fracture Fractures of fingers are breaks in the bones of the fingers. There are many types of fractures. There are different ways of treating these fractures. Your health care provider will discuss the best way to treat your fracture. CAUSES Traumatic injury is the main cause of broken fingers. These include:  Injuries while playing sports.  Workplace  injuries.  Falls. RISK FACTORS Activities that can increase your risk of finger fractures include:  Sports.  Workplace activities that involve machinery.  A condition called osteoporosis, which can make your bones less dense and cause them to fracture more easily. SIGNS AND SYMPTOMS The main symptoms of a broken finger are pain and swelling within 15 minutes after the injury. Other symptoms include:  Bruising of your finger.  Stiffness of your finger.  Numbness of your finger.  Exposed bones (compound fracture) if the fracture is severe. DIAGNOSIS  The best way to diagnose a broken bone is with X-ray imaging. Additionally, your health care provider will use this X-ray image to evaluate the position of the broken finger bones.  TREATMENT  Finger fractures can be treated with:   Nonreduction--This means the bones are in place. The finger is splinted without changing the positions of the bone pieces. The splint is usually left on for about a week to 10 days. This will depend on your fracture and what your health care provider thinks.  Closed reduction--The bones are put back into position without using surgery. The finger is then splinted.  Open reduction and internal fixation--The fracture site is opened. Then the bone pieces are fixed into place with pins or some type of hardware. This is seldom required. It depends on the severity of the fracture. HOME CARE INSTRUCTIONS   Follow your health care provider's instructions regarding activities, exercises, and physical therapy.  Only take over-the-counter or prescription medicines for pain, discomfort, or fever as directed by your health care provider. SEEK MEDICAL CARE IF: You have pain or swelling that limits the motion or use of your fingers. SEEK IMMEDIATE MEDICAL CARE IF:  Your finger becomes numb. MAKE SURE YOU:   Understand these instructions.  Will watch your condition.  Will get help right away if you are not doing  well or get worse.   This information is not intended to replace advice given to you by your health care provider. Make sure you discuss any questions you have with your health care provider.   Document Released: 11/16/2000 Document Revised: 05/25/2013 Document Reviewed: 03/16/2013 Elsevier Interactive Patient Education [employer].
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
## 2016-02-27 ENCOUNTER — Encounter (HOSPITAL_BASED_OUTPATIENT_CLINIC_OR_DEPARTMENT_OTHER): Payer: Self-pay | Admitting: *Deleted

## 2016-02-28 ENCOUNTER — Encounter (HOSPITAL_BASED_OUTPATIENT_CLINIC_OR_DEPARTMENT_OTHER): Payer: Self-pay | Admitting: *Deleted

## 2016-02-28 ENCOUNTER — Ambulatory Visit (HOSPITAL_BASED_OUTPATIENT_CLINIC_OR_DEPARTMENT_OTHER)
Admission: RE | Admit: 2016-02-28 | Discharge: 2016-02-28 | Disposition: A | Discharge status: Home / Self Care | Payer: PRIVATE HEALTH INSURANCE | Source: Ambulatory Visit | Attending: Orthopedic Surgery | Admitting: Orthopedic Surgery

## 2016-02-28 ENCOUNTER — Encounter (HOSPITAL_BASED_OUTPATIENT_CLINIC_OR_DEPARTMENT_OTHER)
Admission: RE | Disposition: A | Discharge status: Home / Self Care | Payer: Self-pay | Source: Ambulatory Visit | Attending: Orthopedic Surgery

## 2016-02-28 ENCOUNTER — Ambulatory Visit (HOSPITAL_BASED_OUTPATIENT_CLINIC_OR_DEPARTMENT_OTHER): Payer: PRIVATE HEALTH INSURANCE | Admitting: Certified Registered"

## 2016-02-28 DIAGNOSIS — S62521A Displaced fracture of distal phalanx of right thumb, initial encounter for closed fracture: Secondary | ICD-10-CM | POA: Insufficient documentation

## 2016-02-28 DIAGNOSIS — S66320A Laceration of extensor muscle, fascia and tendon of right index finger at wrist and hand level, initial encounter: Secondary | ICD-10-CM | POA: Diagnosis not present

## 2016-02-28 DIAGNOSIS — Y9355 Activity, bike riding: Secondary | ICD-10-CM | POA: Insufficient documentation

## 2016-02-28 DIAGNOSIS — I10 Essential (primary) hypertension: Secondary | ICD-10-CM | POA: Insufficient documentation

## 2016-02-28 DIAGNOSIS — Y929 Unspecified place or not applicable: Secondary | ICD-10-CM | POA: Insufficient documentation

## 2016-02-28 DIAGNOSIS — S66322A Laceration of extensor muscle, fascia and tendon of right middle finger at wrist and hand level, initial encounter: Secondary | ICD-10-CM | POA: Insufficient documentation

## 2016-02-28 DIAGNOSIS — Z79899 Other long term (current) drug therapy: Secondary | ICD-10-CM | POA: Diagnosis not present

## 2016-02-28 HISTORY — PX: WOUND EXPLORATION: SHX6188

## 2016-02-28 HISTORY — PX: CLOSED REDUCTION FINGER WITH PERCUTANEOUS PINNING: SHX5612

## 2016-02-28 HISTORY — DX: Fracture of unspecified phalanx of unspecified thumb, initial encounter for closed fracture: S62.509A

## 2016-02-28 SURGERY — CLOSED REDUCTION, FINGER, WITH PERCUTANEOUS PINNING
Anesthesia: General | Site: Hand | Laterality: Right

## 2016-02-28 MED ORDER — FENTANYL CITRATE (PF) 100 MCG/2ML IJ SOLN
INTRAMUSCULAR | Status: AC
  Filled 2016-02-28: qty 2

## 2016-02-28 MED ORDER — DEXAMETHASONE SODIUM PHOSPHATE 4 MG/ML IJ SOLN
INTRAMUSCULAR | Status: DC | PRN
  Administered 2016-02-28: 10 mg via INTRAVENOUS

## 2016-02-28 MED ORDER — GLYCOPYRROLATE 0.2 MG/ML IJ SOLN: 0.2000 mg | Freq: Once | INTRAMUSCULAR | Status: DC | PRN

## 2016-02-28 MED ORDER — OXYCODONE-ACETAMINOPHEN 5-325 MG PO TABS: ORAL_TABLET | ORAL | Status: DC

## 2016-02-28 MED ORDER — HYDROMORPHONE HCL 1 MG/ML IJ SOLN
INTRAMUSCULAR | Status: AC
  Filled 2016-02-28: qty 1

## 2016-02-28 MED ORDER — HYDROMORPHONE HCL 1 MG/ML IJ SOLN
0.2500 mg | INTRAMUSCULAR | Status: DC | PRN
  Administered 2016-02-28 (×4): 0.5 mg via INTRAVENOUS

## 2016-02-28 MED ORDER — ONDANSETRON HCL 4 MG/2ML IJ SOLN
INTRAMUSCULAR | Status: AC
  Filled 2016-02-28: qty 2

## 2016-02-28 MED ORDER — BUPIVACAINE HCL (PF) 0.25 % IJ SOLN
INTRAMUSCULAR | Status: AC
  Filled 2016-02-28: qty 30

## 2016-02-28 MED ORDER — SCOPOLAMINE 1 MG/3DAYS TD PT72
1.0000 | MEDICATED_PATCH | Freq: Once | TRANSDERMAL | Status: DC | PRN
Start: 2016-02-28 — End: 2016-02-28

## 2016-02-28 MED ORDER — LIDOCAINE 2% (20 MG/ML) 5 ML SYRINGE
INTRAMUSCULAR | Status: DC | PRN
  Administered 2016-02-28: 80 mg via INTRAVENOUS

## 2016-02-28 MED ORDER — SULFAMETHOXAZOLE-TRIMETHOPRIM 800-160 MG PO TABS: 1.0000 | ORAL_TABLET | Freq: Two times a day (BID) | ORAL | Status: DC

## 2016-02-28 MED ORDER — OXYCODONE HCL 5 MG/5ML PO SOLN: 5.0000 mg | Freq: Once | ORAL | Status: AC | PRN

## 2016-02-28 MED ORDER — OXYCODONE HCL 5 MG PO TABS
5.0000 mg | ORAL_TABLET | Freq: Once | ORAL | Status: AC | PRN
  Administered 2016-02-28: 5 mg via ORAL

## 2016-02-28 MED ORDER — CEFAZOLIN SODIUM-DEXTROSE 2-3 GM-% IV SOLR
INTRAVENOUS | Status: DC | PRN
  Administered 2016-02-28: 2 g via INTRAVENOUS

## 2016-02-28 MED ORDER — PROPOFOL 10 MG/ML IV BOLUS
INTRAVENOUS | Status: AC
  Filled 2016-02-28: qty 20

## 2016-02-28 MED ORDER — MIDAZOLAM HCL 2 MG/2ML IJ SOLN
INTRAMUSCULAR | Status: AC
  Filled 2016-02-28: qty 2

## 2016-02-28 MED ORDER — CEFAZOLIN SODIUM-DEXTROSE 2-4 GM/100ML-% IV SOLN
INTRAVENOUS | Status: AC
  Filled 2016-02-28: qty 100

## 2016-02-28 MED ORDER — BUPIVACAINE HCL (PF) 0.25 % IJ SOLN
INTRAMUSCULAR | Status: DC | PRN
  Administered 2016-02-28: 7 mL

## 2016-02-28 MED ORDER — FENTANYL CITRATE (PF) 100 MCG/2ML IJ SOLN
50.0000 ug | INTRAMUSCULAR | Status: AC | PRN
  Administered 2016-02-28: 100 ug via INTRAVENOUS
  Administered 2016-02-28: 50 ug via INTRAVENOUS
  Administered 2016-02-28 (×2): 25 ug via INTRAVENOUS

## 2016-02-28 MED ORDER — ONDANSETRON HCL 4 MG/2ML IJ SOLN
INTRAMUSCULAR | Status: DC | PRN
  Administered 2016-02-28: 4 mg via INTRAVENOUS

## 2016-02-28 MED ORDER — MIDAZOLAM HCL 2 MG/2ML IJ SOLN
1.0000 mg | INTRAMUSCULAR | Status: DC | PRN
  Administered 2016-02-28: 2 mg via INTRAVENOUS

## 2016-02-28 MED ORDER — DEXAMETHASONE SODIUM PHOSPHATE 10 MG/ML IJ SOLN
INTRAMUSCULAR | Status: AC
  Filled 2016-02-28: qty 1

## 2016-02-28 MED ORDER — OXYCODONE HCL 5 MG PO TABS
ORAL_TABLET | ORAL | Status: AC
  Filled 2016-02-28: qty 1

## 2016-02-28 MED ORDER — ONDANSETRON HCL 4 MG/2ML IJ SOLN: 4.0000 mg | Freq: Four times a day (QID) | INTRAMUSCULAR | Status: DC | PRN

## 2016-02-28 MED ORDER — LACTATED RINGERS IV SOLN
INTRAVENOUS | Status: DC
  Administered 2016-02-28: 13:00:00 via INTRAVENOUS

## 2016-02-28 MED ORDER — LIDOCAINE 2% (20 MG/ML) 5 ML SYRINGE
INTRAMUSCULAR | Status: AC
  Filled 2016-02-28: qty 5

## 2016-02-28 MED ORDER — PROPOFOL 10 MG/ML IV BOLUS
INTRAVENOUS | Status: DC | PRN
  Administered 2016-02-28: 200 mg via INTRAVENOUS
  Administered 2016-02-28: 50 mg via INTRAVENOUS

## 2016-02-28 SURGICAL SUPPLY — 67 items
BAG DECANTER FOR FLEXI CONT (MISCELLANEOUS) IMPLANT
BANDAGE ACE 3X5.8 VEL STRL LF (GAUZE/BANDAGES/DRESSINGS) ×3 IMPLANT
BLADE MINI RND TIP GREEN BEAV (BLADE) IMPLANT
BLADE SURG 15 STRL LF DISP TIS (BLADE) ×4 IMPLANT
BLADE SURG 15 STRL SS (BLADE) ×6
BNDG CMPR 9X4 STRL LF SNTH (GAUZE/BANDAGES/DRESSINGS) ×2
BNDG ELASTIC 2X5.8 VLCR STR LF (GAUZE/BANDAGES/DRESSINGS) IMPLANT
BNDG ESMARK 4X9 LF (GAUZE/BANDAGES/DRESSINGS) ×3 IMPLANT
BNDG GAUZE ELAST 4 BULKY (GAUZE/BANDAGES/DRESSINGS) ×4 IMPLANT
CHLORAPREP W/TINT 26ML (MISCELLANEOUS) ×3 IMPLANT
CORDS BIPOLAR (ELECTRODE) ×3 IMPLANT
COVER BACK TABLE 60X90IN (DRAPES) ×3 IMPLANT
COVER MAYO STAND STRL (DRAPES) ×4 IMPLANT
CUFF TOURNIQUET SINGLE 18IN (TOURNIQUET CUFF) ×4 IMPLANT
DECANTER SPIKE VIAL GLASS SM (MISCELLANEOUS) ×3 IMPLANT
DRAPE EXTREMITY T 121X128X90 (DRAPE) ×4 IMPLANT
DRAPE OEC MINIVIEW 54X84 (DRAPES) ×3 IMPLANT
DRAPE SURG 17X23 STRL (DRAPES) ×4 IMPLANT
GAUZE SPONGE 4X4 12PLY STRL (GAUZE/BANDAGES/DRESSINGS) ×3 IMPLANT
GAUZE XEROFORM 1X8 LF (GAUZE/BANDAGES/DRESSINGS) ×3 IMPLANT
GLOVE BIO SURGEON STRL SZ 6.5 (GLOVE) ×1 IMPLANT
GLOVE BIO SURGEON STRL SZ7.5 (GLOVE) ×3 IMPLANT
GLOVE BIOGEL M STRL SZ7.5 (GLOVE) ×2 IMPLANT
GLOVE BIOGEL PI IND STRL 7.0 (GLOVE) IMPLANT
GLOVE BIOGEL PI IND STRL 8 (GLOVE) ×2 IMPLANT
GLOVE BIOGEL PI IND STRL 8.5 (GLOVE) IMPLANT
GLOVE BIOGEL PI INDICATOR 7.0 (GLOVE) ×1
GLOVE BIOGEL PI INDICATOR 8 (GLOVE) ×1
GLOVE BIOGEL PI INDICATOR 8.5 (GLOVE)
GLOVE SURG ORTHO 8.0 STRL STRW (GLOVE) IMPLANT
GOWN STRL REUS W/ TWL LRG LVL3 (GOWN DISPOSABLE) ×2 IMPLANT
GOWN STRL REUS W/TWL LRG LVL3 (GOWN DISPOSABLE) ×3
LOOP VESSEL MAXI BLUE (MISCELLANEOUS) IMPLANT
NDL HYPO 25X1 1.5 SAFETY (NEEDLE) IMPLANT
NDL SAFETY ECLIPSE 18X1.5 (NEEDLE) IMPLANT
NEEDLE HYPO 18GX1.5 SHARP (NEEDLE)
NEEDLE HYPO 25X1 1.5 SAFETY (NEEDLE) ×3 IMPLANT
NS IRRIG 1000ML POUR BTL (IV SOLUTION) ×3 IMPLANT
PACK BASIN DAY SURGERY FS (CUSTOM PROCEDURE TRAY) ×3 IMPLANT
PAD CAST 3X4 CTTN HI CHSV (CAST SUPPLIES) ×2 IMPLANT
PAD CAST 4YDX4 CTTN HI CHSV (CAST SUPPLIES) IMPLANT
PADDING CAST ABS 4INX4YD NS (CAST SUPPLIES) ×1
PADDING CAST ABS COTTON 4X4 ST (CAST SUPPLIES) ×2 IMPLANT
PADDING CAST COTTON 3X4 STRL (CAST SUPPLIES) ×3
PADDING CAST COTTON 4X4 STRL (CAST SUPPLIES)
SLEEVE SCD COMPRESS KNEE MED (MISCELLANEOUS) IMPLANT
SPEAR EYE SURG WECK-CEL (MISCELLANEOUS) ×3 IMPLANT
SPLINT FINGER 4.25 BULB 911906 (SOFTGOODS) ×1 IMPLANT
SPLINT PLASTER CAST XFAST 3X15 (CAST SUPPLIES) IMPLANT
SPLINT PLASTER XTRA FASTSET 3X (CAST SUPPLIES)
STOCKINETTE 4X48 STRL (DRAPES) ×4 IMPLANT
SUT CHROMIC 4 0 P 3 18 (SUTURE) ×1 IMPLANT
SUT ETHIBOND 3-0 V-5 (SUTURE) IMPLANT
SUT ETHILON 3 0 PS 1 (SUTURE) IMPLANT
SUT ETHILON 4 0 PS 2 18 (SUTURE) ×3 IMPLANT
SUT FIBERWIRE 4-0 18 TAPR NDL (SUTURE)
SUT MERSILENE 4 0 P 3 (SUTURE) ×1 IMPLANT
SUT MERSILENE 6 0 P 1 (SUTURE) IMPLANT
SUT NYLON 9 0 VRM6 (SUTURE) IMPLANT
SUT PROLENE 6 0 P 1 18 (SUTURE) IMPLANT
SUT SILK 4 0 PS 2 (SUTURE) IMPLANT
SUT VICRYL 4-0 PS2 18IN ABS (SUTURE) IMPLANT
SUTURE FIBERWR 4-0 18 TAPR NDL (SUTURE) IMPLANT
SYR BULB 3OZ (MISCELLANEOUS) ×3 IMPLANT
SYR CONTROL 10ML LL (SYRINGE) ×1 IMPLANT
TOWEL OR 17X24 6PK STRL BLUE (TOWEL DISPOSABLE) ×6 IMPLANT
UNDERPAD 30X30 (UNDERPADS AND DIAPERS) ×3 IMPLANT

## 2016-02-28 NOTE — Discharge Instructions (Addendum)
[facility] Instructions °Hand Surgery ° °Wound Care: °Keep your hand elevated above the level of your heart.  Do not allow it to dangle by your side.  Keep the dressing dry and do not remove it unless your doctor advises you to do so.  He will usually change it at the time of your post-op visit.  Moving your fingers is advised to stimulate circulation but will depend on the site of your surgery.  If you have a splint applied, your doctor will advise you regarding movement. ° °Activity: °Do not drive or operate machinery today.  Rest today and then you may return to your normal activity and work as indicated by your physician. ° °Diet:  °Drink liquids today or eat a light diet.  You may resume a regular diet tomorrow.   ° °General expectations: °Pain for two to three days. °Fingers may become slightly swollen. ° °Call your doctor if any of the following occur: °Severe pain not relieved by pain medication. °Elevated temperature. °Dressing soaked with blood. °Inability to move fingers. °White or bluish color to fingers. ° ° °Post Anesthesia Home Care Instructions ° °Activity: °Get plenty of rest for the remainder of the day. A responsible adult should stay with you for 24 hours following the procedure.  °For the next 24 hours, DO NOT: °-Drive a car °-Operate machinery °-Drink alcoholic beverages °-Take any medication unless instructed by your physician °-Make any legal decisions or sign important papers. ° °Meals: °Start with liquid foods such as gelatin or soup. Progress to regular foods as tolerated. Avoid greasy, spicy, heavy foods. If nausea and/or vomiting occur, drink only clear liquids until the nausea and/or vomiting subsides. Call your physician if vomiting continues. ° °Special Instructions/Symptoms: °Your throat may feel dry or sore from the anesthesia or the breathing tube placed in your throat during surgery. If this causes discomfort, gargle with warm salt water. The discomfort should disappear within 24  hours. ° °If you had a scopolamine patch placed behind your ear for the management of post- operative nausea and/or vomiting: ° °1. The medication in the patch is effective for 72 hours, after which it should be removed.  Wrap patch in a tissue and discard in the trash. Wash hands thoroughly with soap and water. °2. You may remove the patch earlier than 72 hours if you experience unpleasant side effects which may include dry mouth, dizziness or visual disturbances. °3. Avoid touching the patch. Wash your hands with soap and water after contact with the patch. °  ° ° °Post Anesthesia Home Care Instructions ° °Activity: °Get plenty of rest for the remainder of the day. A responsible adult should stay with you for 24 hours following the procedure.  °For the next 24 hours, DO NOT: °-Drive a car °-Operate machinery °-Drink alcoholic beverages °-Take any medication unless instructed by your physician °-Make any legal decisions or sign important papers. ° °Meals: °Start with liquid foods such as gelatin or soup. Progress to regular foods as tolerated. Avoid greasy, spicy, heavy foods. If nausea and/or vomiting occur, drink only clear liquids until the nausea and/or vomiting subsides. Call your physician if vomiting continues. ° °Special Instructions/Symptoms: °Your throat may feel dry or sore from the anesthesia or the breathing tube placed in your throat during surgery. If this causes discomfort, gargle with warm salt water. The discomfort should disappear within 24 hours. ° °If you had a scopolamine patch placed behind your ear for the management of post- operative nausea and/or vomiting: ° °  1. The medication in the patch is effective for 72 hours, after which it should be removed.  Wrap patch in a tissue and discard in the trash. Wash hands thoroughly with soap and water. °2. You may remove the patch earlier than 72 hours if you experience unpleasant side effects which may include dry mouth, dizziness or visual  disturbances. °3. Avoid touching the patch. Wash your hands with soap and water after contact with the patch. °  ° °

## 2016-02-28 NOTE — Transfer of Care (Signed)
Immediate Anesthesia Transfer of Care Note  Patient: Todd Pham  Procedure(s) Performed: Procedure(s): RIGHT THUMB CLOSED REDUCTION AND PINNING (Right) LEFT LONG WOUND EXPLORATION  REPAIR EXTENSOR TENDON  (Left)  Patient Location: PACU  Anesthesia Type:General  Level of Consciousness: sedated, patient cooperative and responds to stimulation  Airway & Oxygen Therapy: Patient Spontanous Breathing and Patient connected to face mask oxygen  Post-op Assessment: Report given to RN and Post -op Vital signs reviewed and stable  Post vital signs: Reviewed and stable  Last Vitals:  Filed Vitals:   02/28/16 1154  BP: 129/81  Pulse: 72  Temp: 36.8 C  Resp: 20    Last Pain:  Filed Vitals:   02/28/16 1156  PainSc: 1          Complications: No apparent anesthesia complications

## 2016-02-28 NOTE — Anesthesia Preprocedure Evaluation (Signed)
Anesthesia Evaluation  Patient identified by MRN, date of birth, ID band Patient awake    Reviewed: Allergy & Precautions, H&P , NPO status , Patient's Chart, lab work & pertinent test results  Airway Mallampati: II   Neck ROM: full    Dental   Pulmonary neg pulmonary ROS,    breath sounds clear to auscultation       Cardiovascular hypertension,  Rhythm:regular Rate:Normal     Neuro/Psych    GI/Hepatic   Endo/Other    Renal/GU      Musculoskeletal   Abdominal   Peds  Hematology   Anesthesia Other Findings   Reproductive/Obstetrics                             Anesthesia Physical Anesthesia Plan  ASA: I  Anesthesia Plan: General   Post-op Pain Management:    Induction: Intravenous  Airway Management Planned: LMA  Additional Equipment:   Intra-op Plan:   Post-operative Plan:   Informed Consent: I have reviewed the patients History and Physical, chart, labs and discussed the procedure including the risks, benefits and alternatives for the proposed anesthesia with the patient or authorized representative who has indicated his/her understanding and acceptance.     Plan Discussed with: CRNA, Anesthesiologist and Surgeon  Anesthesia Plan Comments:         Anesthesia Quick Evaluation
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
## 2016-02-28 NOTE — Anesthesia Postprocedure Evaluation (Signed)
Anesthesia Post Note  Patient: Todd Pham  Procedure(s) Performed: Procedure(s) (LRB): RIGHT THUMB CLOSED REDUCTION AND PINNING (Right) LEFT LONG WOUND EXPLORATION  REPAIR EXTENSOR TENDON  (Left)  Patient location during evaluation: PACU Anesthesia Type: General Level of consciousness: awake and alert and patient cooperative Pain management: pain level controlled Vital Signs Assessment: post-procedure vital signs reviewed and stable Respiratory status: spontaneous breathing and respiratory function stable Cardiovascular status: stable Anesthetic complications: no    Last Vitals:  Filed Vitals:   02/28/16 1545 02/28/16 1600  BP: 143/90 140/82  Pulse: 74 72  Temp:    Resp: 13 14    Last Pain:  Filed Vitals:   02/28/16 1635  PainSc: [place]
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
## 2016-02-28 NOTE — Anesthesia Procedure Notes (Signed)
Procedure Name: LMA Insertion Date/Time: 02/28/2016 2:11 PM Performed by: Lyndee Leo Pre-anesthesia Checklist: Patient identified, Emergency Drugs available, Suction available and Patient being monitored Patient Re-evaluated:Patient Re-evaluated prior to inductionOxygen Delivery Method: Circle system utilized Preoxygenation: Pre-oxygenation with 100% oxygen Intubation Type: IV induction Ventilation: Mask ventilation without difficulty LMA: LMA inserted LMA Size: 5.0 Number of attempts: 1 Airway Equipment and Method: Bite block Placement Confirmation: positive ETCO2 Tube secured with: Tape Dental Injury: Teeth and Oropharynx as per pre-operative assessment

## 2016-02-28 NOTE — Op Note (Signed)
Intra-operative fluoroscopic images in the AP, lateral, and oblique views were taken and evaluated by myself.  Reduction and hardware placement were confirmed.  There was no intraarticular penetration of permanent hardware.  
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
## 2016-03-02 ENCOUNTER — Encounter (HOSPITAL_BASED_OUTPATIENT_CLINIC_OR_DEPARTMENT_OTHER): Payer: Self-pay | Admitting: Orthopedic Surgery

## 2021-05-06 ENCOUNTER — Other Ambulatory Visit: Payer: Self-pay | Admitting: Sports Medicine

## 2021-05-06 DIAGNOSIS — R2242 Localized swelling, mass and lump, left lower limb: Secondary | ICD-10-CM

## 2021-05-19 ENCOUNTER — Ambulatory Visit
Admission: RE | Admit: 2021-05-19 | Discharge: 2021-05-19 | Disposition: A | Discharge status: Home / Self Care | Payer: No Typology Code available for payment source | Source: Ambulatory Visit | Attending: Sports Medicine | Admitting: Sports Medicine

## 2021-05-19 ENCOUNTER — Other Ambulatory Visit: Payer: Self-pay

## 2021-05-19 DIAGNOSIS — R2242 Localized swelling, mass and lump, left lower limb: Secondary | ICD-10-CM

## 2021-05-19 MED ORDER — GADOBENATE DIMEGLUMINE 529 MG/ML IV SOLN
20.0000 mL | Freq: Once | INTRAVENOUS | Status: AC | PRN
  Administered 2021-05-19: 20 mL via INTRAVENOUS

## 2021-10-09 IMAGING — MR MR KNEE*L* WO/W CM
9 series · 40 of 40 positions shown · IV contrast (with contrast)
Comparison: None.

CLINICAL DATA: Posterior left knee lump for 3 years. No known
injury or prior relevant surgery. Patient reports fluid leaking from
area with surrounding erythema.

EXAM:
MRI OF THE LEFT KNEE WITHOUT AND WITH CONTRAST
TECHNIQUE: Multiplanar, multisequence MR imaging of the left knee was performed
both before and after administration of intravenous contrast.
CONTRAST:  20mL MULTIHANCE GADOBENATE DIMEGLUMINE 529 MG/ML IV SOLN

[Series 6: T2 fat-sat · axial · left · 4.0mm · 0.53mm/px · z∈[-83,+87]mm · 6 of 40 slices shown (1 of 2)]
[im 1/40]
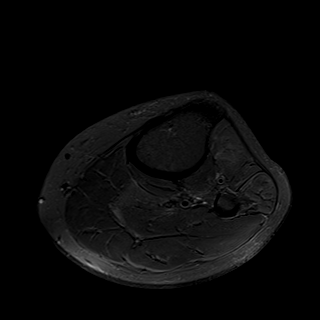
[im 8/40]
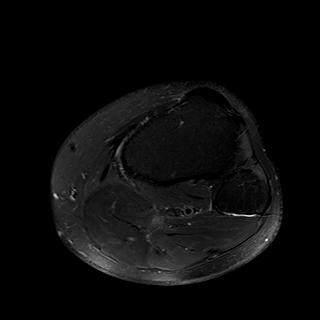
[im 16/40]
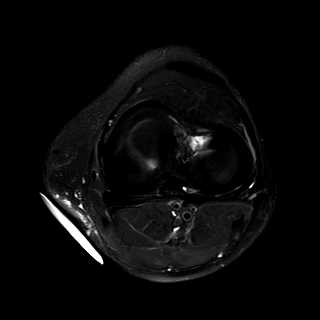
[im 24/40]
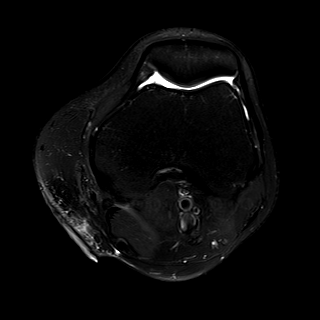
[im 32/40]
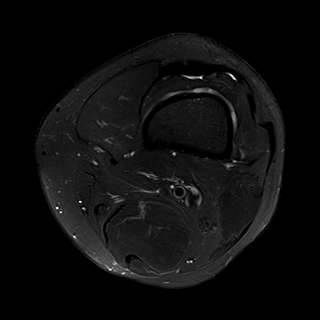
[im 40/40]
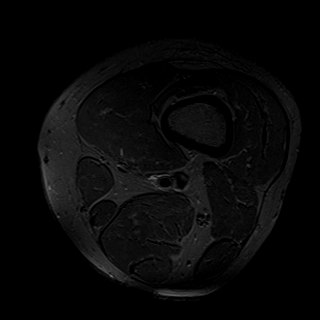

[Series 7: T1 · axial · left · 4.0mm · 0.53mm/px · z∈[-83,+87]mm · 6 of 40 slices shown (1 of 2)]
[im 1/40]
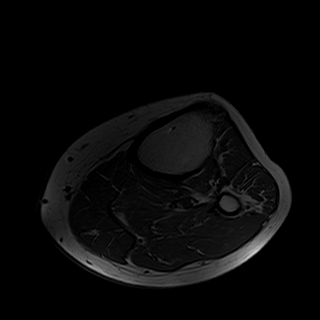
[im 8/40]
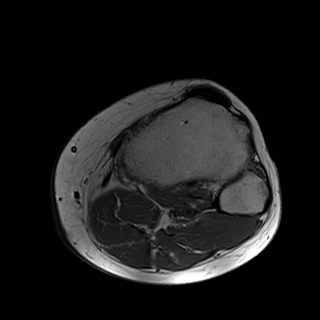
[im 16/40]
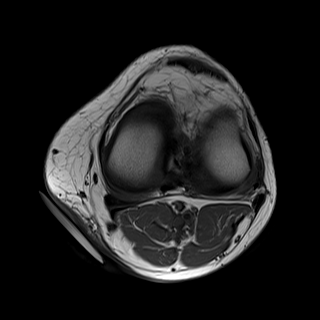
[im 24/40]
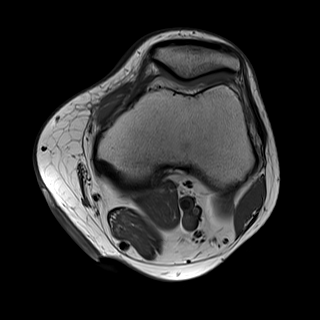
[im 32/40]
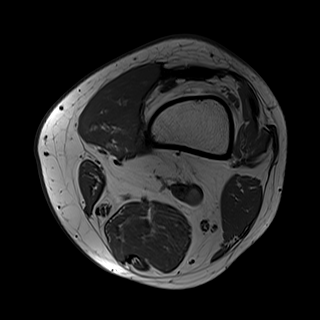
[im 40/40]
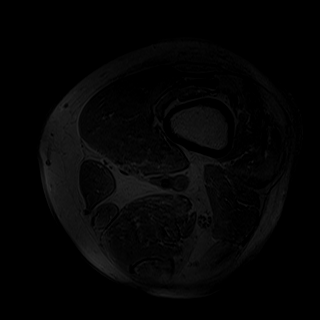

[Series 8: T1 · coronal · left · 4.0mm · 0.50mm/px · 4 of 31 slices shown (2 of 2)]
[im 1/31]
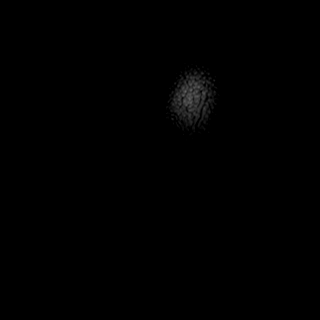
[im 11/31]
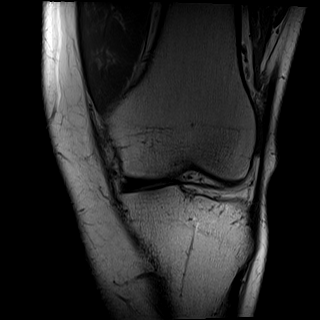
[im 21/31]
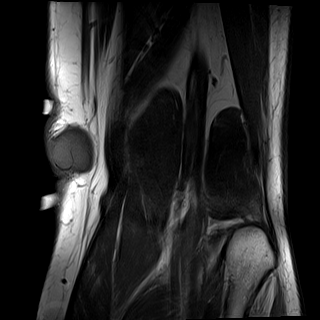
[im 31/31]
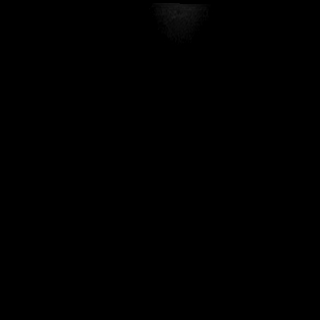

[Series 9: T2 fat-sat · coronal · left · 4.0mm · 0.50mm/px · 4 of 29 slices shown (2 of 2)]
[im 1/29]
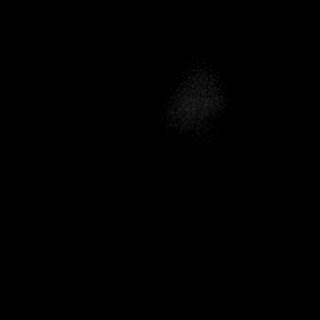
[im 10/29]
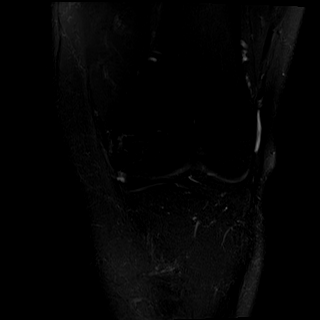
[im 19/29]
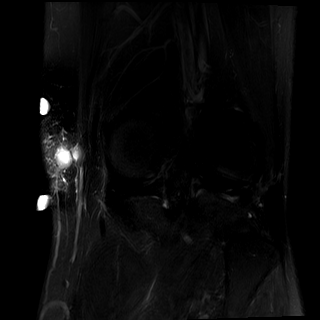
[im 29/29]
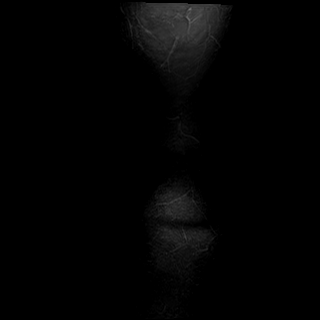

[Series 10: STIR · sagittal · left · 4.0mm · 0.66mm/px · 5 of 34 slices shown]
[im 1/34]
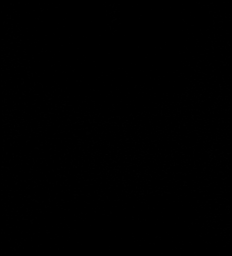
[im 9/34]
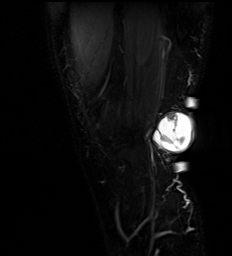
[im 17/34]
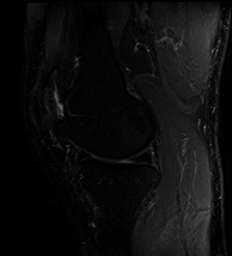
[im 25/34]
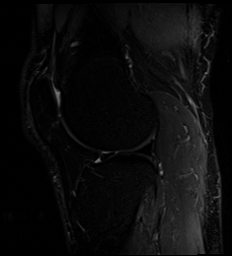
[im 34/34]
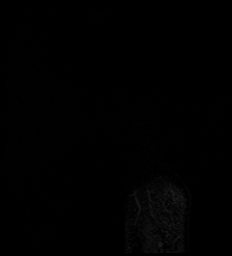

[Series 11: T1 fat-sat · axial · non-contrast · left · 4.0mm · 0.53mm/px · z∈[-83,+87]mm · 5 of 40 slices shown]
[im 1/40]
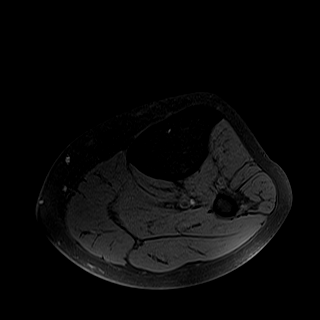
[im 10/40]
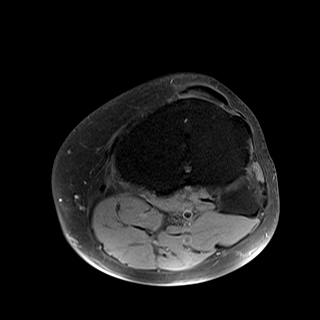
[im 20/40]
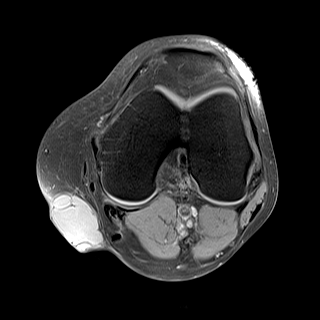
[im 30/40]
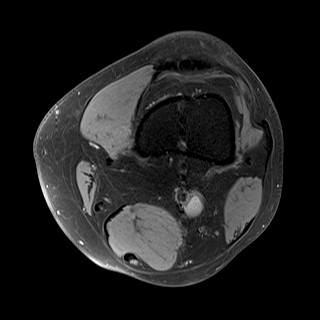
[im 40/40]
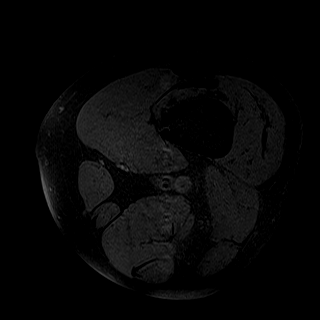

[Series 12: T1 fat-sat post-contrast · axial · left · 4.0mm · 0.53mm/px · z∈[-83,+87]mm · 2 of 14 slices shown (1 of 3)]
[im 1/14]
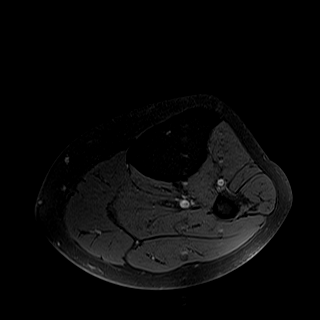
[im 14/14]
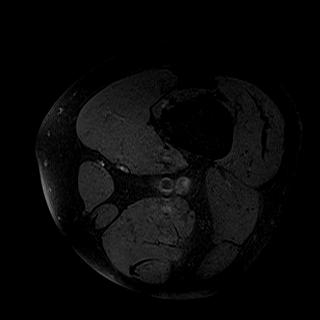

[Series 13: T1 fat-sat post-contrast · coronal · left · 4.0mm · 0.50mm/px · 4 of 31 slices shown (2 of 3)]
[im 1/31]
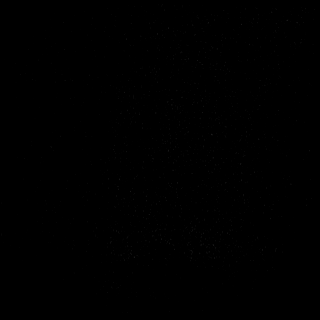
[im 11/31]
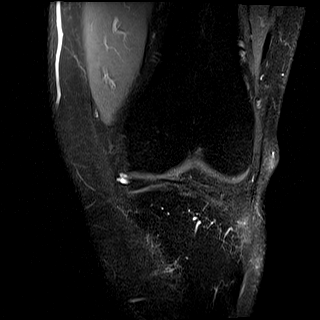
[im 21/31]
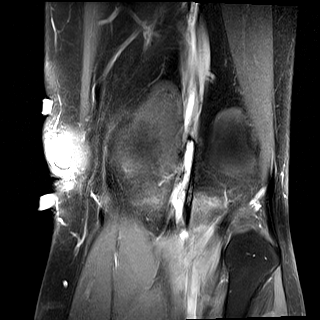
[im 31/31]
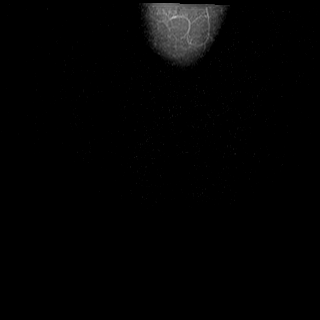

[Series 14: T1 fat-sat post-contrast · sagittal · left · 4.0mm · 0.62mm/px · 4 of 32 slices shown (3 of 3)]
[im 1/32]
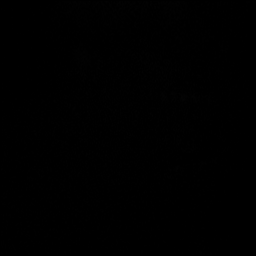
[im 11/32]
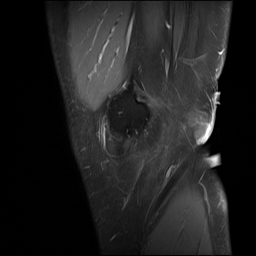
[im 21/32]
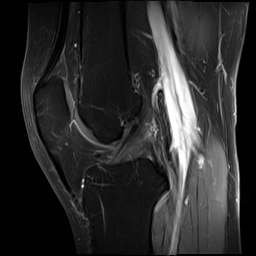
[im 32/32]
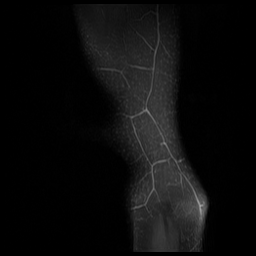

[40 of 40 positions shown; findings below may reference images not displayed]

FINDINGS: Capsules were placed around the patient's palpable concern
posteromedially. There is a well-circumscribed underlying cystic
lesion within the subcutaneous fat which is mildly septated and
exophytic, measuring 3.4 x 2.1 x 2.8 cm. This demonstrates
intermediate T1 and high T2 signal. There is no central enhancement
of this lesion following contrast. There is mild surrounding
subcutaneous edema and enhancement. No communication with the joint
demonstrated.

MENISCI

Medial meniscus:  Intact with normal morphology.

Lateral meniscus:  Intact with normal morphology.

LIGAMENTS

Cruciates:  Intact.

Collaterals:  Intact.

CARTILAGE

Patellofemoral:  Preserved.

Medial:  Preserved.

Lateral:  Preserved.

MISCELLANEOUS

Joint: No significant joint effusion or suspicious synovial
enhancement.

Popliteal Fossa: The popliteus muscle and tendon are intact. No
significant Baker's cyst.

Extensor Mechanism:  Intact.

Bones:  No acute or significant extra-articular osseous findings.

Other: No other significant periarticular soft tissue findings. No
vascular abnormalities are seen.
IMPRESSION: 1. Mildly septated subcutaneous cystic lesion posteromedially with
nonspecific surrounding soft tissue edema and enhancement. This
cystic lesion demonstrates no central enhancement and appears
nonaggressive, likely reflecting a ganglion or posttraumatic
hematoma/seroma. Given the reported associated erythema and
drainage, this may be secondarily infected.
2. No intra-articular abnormality identified. The menisci, cruciate
and collateral ligaments appear intact.

## 2022-02-05 ENCOUNTER — Ambulatory Visit: Payer: Self-pay | Admitting: General Surgery

## 2022-02-19 ENCOUNTER — Encounter (HOSPITAL_BASED_OUTPATIENT_CLINIC_OR_DEPARTMENT_OTHER): Payer: Self-pay | Admitting: General Surgery

## 2022-02-19 ENCOUNTER — Other Ambulatory Visit: Payer: Self-pay

## 2022-02-27 ENCOUNTER — Other Ambulatory Visit: Payer: Self-pay

## 2022-02-27 ENCOUNTER — Encounter (HOSPITAL_BASED_OUTPATIENT_CLINIC_OR_DEPARTMENT_OTHER): Payer: Self-pay | Admitting: General Surgery

## 2022-02-27 ENCOUNTER — Ambulatory Visit (HOSPITAL_BASED_OUTPATIENT_CLINIC_OR_DEPARTMENT_OTHER)
Admission: RE | Admit: 2022-02-27 | Discharge: 2022-02-27 | Disposition: A | Discharge status: Home / Self Care | Payer: No Typology Code available for payment source | Attending: General Surgery | Admitting: General Surgery

## 2022-02-27 ENCOUNTER — Encounter (HOSPITAL_BASED_OUTPATIENT_CLINIC_OR_DEPARTMENT_OTHER)
Admission: RE | Disposition: A | Discharge status: Home / Self Care | Payer: Self-pay | Source: Home / Self Care | Attending: General Surgery

## 2022-02-27 ENCOUNTER — Ambulatory Visit (HOSPITAL_BASED_OUTPATIENT_CLINIC_OR_DEPARTMENT_OTHER): Payer: No Typology Code available for payment source | Admitting: Anesthesiology

## 2022-02-27 DIAGNOSIS — D171 Benign lipomatous neoplasm of skin and subcutaneous tissue of trunk: Secondary | ICD-10-CM

## 2022-02-27 DIAGNOSIS — Z01818 Encounter for other preprocedural examination: Secondary | ICD-10-CM

## 2022-02-27 DIAGNOSIS — D1779 Benign lipomatous neoplasm of other sites: Secondary | ICD-10-CM | POA: Insufficient documentation

## 2022-02-27 HISTORY — PX: LIPOMA EXCISION: SHX5283

## 2022-02-27 SURGERY — EXCISION LIPOMA
Anesthesia: General | Site: Chest | Laterality: Left

## 2022-02-27 MED ORDER — PROPOFOL 10 MG/ML IV BOLUS
INTRAVENOUS | Status: AC
  Filled 2022-02-27: qty 20

## 2022-02-27 MED ORDER — ONDANSETRON HCL 4 MG/2ML IJ SOLN
INTRAMUSCULAR | Status: AC
  Filled 2022-02-27: qty 2

## 2022-02-27 MED ORDER — PHENYLEPHRINE 80 MCG/ML (10ML) SYRINGE FOR IV PUSH (FOR BLOOD PRESSURE SUPPORT)
PREFILLED_SYRINGE | INTRAVENOUS | Status: AC
  Filled 2022-02-27: qty 10

## 2022-02-27 MED ORDER — PHENYLEPHRINE 80 MCG/ML (10ML) SYRINGE FOR IV PUSH (FOR BLOOD PRESSURE SUPPORT)
PREFILLED_SYRINGE | INTRAVENOUS | Status: DC | PRN
  Administered 2022-02-27: 240 ug via INTRAVENOUS
  Administered 2022-02-27: 160 ug via INTRAVENOUS
  Administered 2022-02-27: 80 ug via INTRAVENOUS
  Administered 2022-02-27: 160 ug via INTRAVENOUS
  Administered 2022-02-27: 80 ug via INTRAVENOUS
  Administered 2022-02-27: 160 ug via INTRAVENOUS
  Administered 2022-02-27: 80 ug via INTRAVENOUS
  Administered 2022-02-27: 160 ug via INTRAVENOUS

## 2022-02-27 MED ORDER — ACETAMINOPHEN 500 MG PO TABS
ORAL_TABLET | ORAL | Status: AC
  Filled 2022-02-27: qty 2

## 2022-02-27 MED ORDER — 0.9 % SODIUM CHLORIDE (POUR BTL) OPTIME
TOPICAL | Status: DC | PRN
  Administered 2022-02-27: 500 mL

## 2022-02-27 MED ORDER — ONDANSETRON HCL 4 MG/2ML IJ SOLN
INTRAMUSCULAR | Status: DC | PRN
  Administered 2022-02-27: 4 mg via INTRAVENOUS

## 2022-02-27 MED ORDER — CHLORHEXIDINE GLUCONATE CLOTH 2 % EX PADS: 6.0000 | MEDICATED_PAD | Freq: Once | CUTANEOUS | Status: DC

## 2022-02-27 MED ORDER — MIDAZOLAM HCL 2 MG/2ML IJ SOLN
INTRAMUSCULAR | Status: AC
  Filled 2022-02-27: qty 2

## 2022-02-27 MED ORDER — CELECOXIB 200 MG PO CAPS
ORAL_CAPSULE | ORAL | Status: AC
  Filled 2022-02-27: qty 2

## 2022-02-27 MED ORDER — LACTATED RINGERS IV SOLN: INTRAVENOUS | Status: DC

## 2022-02-27 MED ORDER — ACETAMINOPHEN 500 MG PO TABS
1000.0000 mg | ORAL_TABLET | ORAL | Status: AC
  Administered 2022-02-27: 1000 mg via ORAL

## 2022-02-27 MED ORDER — PROPOFOL 10 MG/ML IV BOLUS
INTRAVENOUS | Status: DC | PRN
  Administered 2022-02-27: 200 mg via INTRAVENOUS

## 2022-02-27 MED ORDER — FENTANYL CITRATE (PF) 100 MCG/2ML IJ SOLN
INTRAMUSCULAR | Status: DC | PRN
  Administered 2022-02-27 (×2): 50 ug via INTRAVENOUS

## 2022-02-27 MED ORDER — CEFAZOLIN SODIUM-DEXTROSE 2-4 GM/100ML-% IV SOLN
2.0000 g | INTRAVENOUS | Status: AC
  Administered 2022-02-27: 2 g via INTRAVENOUS

## 2022-02-27 MED ORDER — BUPIVACAINE-EPINEPHRINE 0.5% -1:200000 IJ SOLN
INTRAMUSCULAR | Status: DC | PRN
  Administered 2022-02-27: 20 mL

## 2022-02-27 MED ORDER — DEXAMETHASONE SODIUM PHOSPHATE 10 MG/ML IJ SOLN
INTRAMUSCULAR | Status: AC
  Filled 2022-02-27: qty 1

## 2022-02-27 MED ORDER — DEXMEDETOMIDINE HCL IN NACL 80 MCG/20ML IV SOLN
INTRAVENOUS | Status: AC
  Filled 2022-02-27: qty 20

## 2022-02-27 MED ORDER — LIDOCAINE 2% (20 MG/ML) 5 ML SYRINGE
INTRAMUSCULAR | Status: DC | PRN
  Administered 2022-02-27: 60 mg via INTRAVENOUS

## 2022-02-27 MED ORDER — MIDAZOLAM HCL 5 MG/5ML IJ SOLN
INTRAMUSCULAR | Status: DC | PRN
  Administered 2022-02-27: 2 mg via INTRAVENOUS

## 2022-02-27 MED ORDER — CELECOXIB 200 MG PO CAPS
400.0000 mg | ORAL_CAPSULE | ORAL | Status: AC
  Administered 2022-02-27: 400 mg via ORAL

## 2022-02-27 MED ORDER — ENSURE PRE-SURGERY PO LIQD: 296.0000 mL | Freq: Once | ORAL | Status: DC

## 2022-02-27 MED ORDER — DEXMEDETOMIDINE (PRECEDEX) IN NS 20 MCG/5ML (4 MCG/ML) IV SYRINGE
PREFILLED_SYRINGE | INTRAVENOUS | Status: DC | PRN
  Administered 2022-02-27: 8 ug via INTRAVENOUS

## 2022-02-27 MED ORDER — FENTANYL CITRATE (PF) 100 MCG/2ML IJ SOLN
INTRAMUSCULAR | Status: AC
  Filled 2022-02-27: qty 2

## 2022-02-27 MED ORDER — CEFAZOLIN SODIUM-DEXTROSE 2-4 GM/100ML-% IV SOLN
INTRAVENOUS | Status: AC
  Filled 2022-02-27: qty 100

## 2022-02-27 MED ORDER — IBUPROFEN 800 MG PO TABS: 800.0000 mg | ORAL_TABLET | Freq: Three times a day (TID) | ORAL | 0 refills | Status: AC | PRN

## 2022-02-27 MED ORDER — FENTANYL CITRATE (PF) 100 MCG/2ML IJ SOLN: 25.0000 ug | INTRAMUSCULAR | Status: DC | PRN

## 2022-02-27 MED ORDER — DEXAMETHASONE SODIUM PHOSPHATE 10 MG/ML IJ SOLN
INTRAMUSCULAR | Status: DC | PRN
  Administered 2022-02-27: 10 mg via INTRAVENOUS

## 2022-02-27 MED ORDER — TRAMADOL HCL 50 MG PO TABS: 50.0000 mg | ORAL_TABLET | Freq: Three times a day (TID) | ORAL | 0 refills | Status: AC | PRN

## 2022-02-27 SURGICAL SUPPLY — 38 items
ADH SKN CLS APL DERMABOND .7 (GAUZE/BANDAGES/DRESSINGS) ×1
APL PRP STRL LF DISP 70% ISPRP (MISCELLANEOUS) ×1
BLADE SURG 15 STRL LF DISP TIS (BLADE) ×2 IMPLANT
BLADE SURG 15 STRL SS (BLADE) ×2
CHLORAPREP W/TINT 26 (MISCELLANEOUS) ×3 IMPLANT
COVER BACK TABLE 60X90IN (DRAPES) ×3 IMPLANT
COVER MAYO STAND STRL (DRAPES) ×3 IMPLANT
DECANTER SPIKE VIAL GLASS SM (MISCELLANEOUS) ×1 IMPLANT
DERMABOND ADVANCED (GAUZE/BANDAGES/DRESSINGS) ×1
DERMABOND ADVANCED .7 DNX12 (GAUZE/BANDAGES/DRESSINGS) IMPLANT
DRAPE LAPAROTOMY 100X72 PEDS (DRAPES) ×3 IMPLANT
DRAPE UTILITY XL STRL (DRAPES) ×3 IMPLANT
ELECT COATED BLADE 2.86 ST (ELECTRODE) ×1 IMPLANT
ELECT REM PT RETURN 9FT ADLT (ELECTROSURGICAL) ×2
ELECTRODE REM PT RTRN 9FT ADLT (ELECTROSURGICAL) ×2 IMPLANT
GAUZE 4X4 16PLY ~~LOC~~+RFID DBL (SPONGE) ×3 IMPLANT
GLOVE BIO SURGEON STRL SZ8 (GLOVE) ×1 IMPLANT
GLOVE BIOGEL PI IND STRL 7.0 (GLOVE) ×2 IMPLANT
GLOVE BIOGEL PI IND STRL 8 (GLOVE) IMPLANT
GLOVE BIOGEL PI INDICATOR 7.0 (GLOVE) ×3
GLOVE BIOGEL PI INDICATOR 8 (GLOVE) ×1
GLOVE SURG SS PI 7.0 STRL IVOR (GLOVE) ×3 IMPLANT
GOWN STRL REUS W/TWL LRG LVL3 (GOWN DISPOSABLE) ×4 IMPLANT
GOWN STRL REUS W/TWL XL LVL3 (GOWN DISPOSABLE) ×1 IMPLANT
KIT TURNOVER CYSTO (KITS) ×3 IMPLANT
MANIFOLD NEPTUNE II (INSTRUMENTS) ×1 IMPLANT
NDL HYPO 25X1 1.5 SAFETY (NEEDLE) ×2 IMPLANT
NEEDLE HYPO 25X1 1.5 SAFETY (NEEDLE) ×2 IMPLANT
NS IRRIG 500ML POUR BTL (IV SOLUTION) ×1 IMPLANT
PACK BASIN DAY SURGERY FS (CUSTOM PROCEDURE TRAY) ×3 IMPLANT
PENCIL SMOKE EVACUATOR (MISCELLANEOUS) ×3 IMPLANT
SUT MNCRL AB 4-0 PS2 18 (SUTURE) ×1 IMPLANT
SUT VIC AB 2-0 SH 27 (SUTURE) ×2
SUT VIC AB 2-0 SH 27XBRD (SUTURE) IMPLANT
SUT VIC AB 3-0 SH 8-18 (SUTURE) ×1 IMPLANT
SYR BULB IRRIG 60ML STRL (SYRINGE) ×1 IMPLANT
SYR CONTROL 10ML LL (SYRINGE) ×3 IMPLANT
TOWEL OR 17X26 10 PK STRL BLUE (TOWEL DISPOSABLE) ×3 IMPLANT

## 2022-02-27 NOTE — Op Note (Signed)
Preoperative diagnosis: left chest wall lipoma  Postoperative diagnosis: same   Procedure: excision of 5 cm subfascial chest wall lipoma  Surgeon: Gurney Maxin, M.D.  Asst: Rosamaria Lints, M.D.  Anesthesia: general  Indications for procedure: Todd Pham is a 51 y.o. year old male with symptoms of enlarging mass over his left lateral chest.  Description of procedure: The patient was brought into the operative suite. Anesthesia was administered with General LMA anesthesia. WHO checklist was applied. The patient was then placed in supine left side bumped position. The area was prepped and draped in the usual sterile fashion.  Next, marcaine was infused over the area. A lateral incision was made through the skin. Cautery was used to dissect through the subcutaneous tissue. The mass was deep to the serratus anterior muscle. The muscle body was incised along the fiber direction. Blunt dissection and cautery was used to dissect the mass free of surrounding attachments.   The mass was removed in its entirety. The wound was irrigated. The incision was closed with 3-0 vicryl in interrupted fashion and the skin was closed with 4-0 monocryl in running subcuticular stitch. Dermabond was put in place for dressing. The patient awoke from anesthesia and brought to pacu in stable condition. All counts were correct. The patient tolerated the procedure well.   Findings: 5 cm fatty mass of left chest wall  Specimen: left chest wall mass  Implant: none   Blood loss: 5 ml  Local anesthesia:  20 ml marcaine   Complications: none  Gurney Maxin, M.D. General, Bariatric, & Minimally Invasive Surgery [facility]
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
## 2022-02-27 NOTE — H&P (Signed)
Chief Complaint: New Patient (Lipoma left rib cage )   History of Present Illness: Todd Pham is a 51 y.o. male who is seen today as an office consultation for evaluation of New Patient (Lipoma left rib cage ) .   He first noticed it 4 years ago. It sometimes is sore or causes numbness in the area. He has had one mass removed from his left posterior knee and 2 from his back. He denies trauma in the area.  Review of Systems: A complete review of systems was obtained from the patient. I have reviewed this information and discussed as appropriate with the patient. See HPI as well for other ROS.  Review of Systems  Constitutional: Negative.  HENT: Negative.  Eyes: Negative.  Respiratory: Negative.  Cardiovascular: Negative.  Gastrointestinal: Negative.  Genitourinary: Negative.  Musculoskeletal: Negative.  Skin: Negative.  Neurological: Negative.  Endo/Heme/Allergies: Negative.  Psychiatric/Behavioral: Negative.    Medical History: History reviewed. No pertinent past medical history.  There is no problem list on file for this patient.  History reviewed. No pertinent surgical history.   No Known Allergies  No current outpatient medications on file prior to visit.   No current facility-administered medications on file prior to visit.   Family History  Problem Relation Age of Onset   High blood pressure (Hypertension) Father    Social History   Tobacco Use  Smoking Status Never  Smokeless Tobacco Never    Social History   Socioeconomic History   Marital status: Married  Tobacco Use   Smoking status: Never   Smokeless tobacco: Never  Substance and Sexual Activity   Alcohol use: Yes  Comment: couple a month   Drug use: Never   Objective:   Vitals:  01/02/22 1030  BP: 132/84  Pulse: 76  Temp: 36.7 C (98 F)  SpO2: 99%  Weight: 95.1 kg (209 lb 9.6 oz)  Height: 180.3 cm ('5\' 11"'$ )   Body mass index is 29.23 kg/m.  Physical Exam Constitutional:   Appearance: Normal appearance.  HENT:  Head: Normocephalic and atraumatic.  Pulmonary:  Effort: Pulmonary effort is normal.  Musculoskeletal:  General: Normal range of motion.  Cervical back: Normal range of motion.  Skin: Comments: Left lateral chest wall around ribs 7-8 a mass 6 x 7 cm in size. Close integration with chest wall  Neurological:  General: No focal deficit present.  Mental Status: He is alert and oriented to person, place, and time. Mental status is at baseline.  Psychiatric:  Mood and Affect: Mood normal.  Behavior: Behavior normal.  Thought Content: Thought content normal.   Labs, Imaging and Diagnostic Testing: I reviewed notes from [facility] and Plan:   Diagnoses and all orders for this visit:  Lipoma of torso  The pathophysiology of skin & subcutaneous masses was discussed. Natural history risks without surgery were discussed. I recommended surgery to remove the mass. I explained the technique of removal with use of local anesthesia & possible need for more aggressive sedation/anesthesia for patient comfort.   Risks such as bleeding, infection, wound breakdown, heart attack, death, and other risks were discussed. I noted a good likelihood this will help address the problem. Possibility that this will not correct all symptoms was explained. Possibility of regrowth/recurrence of the mass was discussed. We will work to minimize complications. Questions were answered. The patient expresses understanding & wishes to proceed with surgery.

## 2022-02-27 NOTE — Discharge Instructions (Addendum)
  Managing Your Pain After Surgery Without Opioids    Thank you for participating in our program to help patients manage their pain after surgery without opioids. This is part of our effort to provide you with the best care possible, without exposing you or your family to the risk that opioids pose.  What pain can I expect after surgery? You can expect to have some pain after surgery. This is normal. The pain is typically worse the day after surgery, and quickly begins to get better. Many studies have found that many patients are able to manage their pain after surgery with Over-the-Counter (OTC) medications such as Tylenol and Motrin. If you have a condition that does not allow you to take Tylenol or Motrin, notify your surgical team.  How will I manage my pain? The best strategy for controlling your pain after surgery is around the clock pain control with Tylenol (acetaminophen) and Motrin (ibuprofen or Advil). Alternating these medications with each other allows you to maximize your pain control. In addition to Tylenol and Motrin, you can use heating pads or ice packs on your incisions to help reduce your pain.  How will I alternate your regular strength over-the-counter pain medication? You will take a dose of pain medication every three hours. Start by taking 650 mg of Tylenol (2 pills of 325 mg) 3 hours later take 600 mg of Motrin (3 pills of 200 mg) 3 hours after taking the Motrin take 650 mg of Tylenol 3 hours after that take 600 mg of Motrin.   - 1 -  See example - if your first dose of Tylenol is at 12:00 PM   12:00 PM Tylenol 650 mg (2 pills of 325 mg)  3:00 PM Motrin 600 mg (3 pills of 200 mg)  6:00 PM Tylenol 650 mg (2 pills of 325 mg)  9:00 PM Motrin 600 mg (3 pills of 200 mg)  Continue alternating every 3 hours   We recommend that you follow this schedule around-the-clock for at least 3 days after surgery, or until you feel that it is no longer needed. Use the table  on the last page of this handout to keep track of the medications you are taking. Important: Do not take more than 3000mg of Tylenol or 3200mg of Motrin in a 24-hour period. Do not take ibuprofen/Motrin if you have a history of bleeding stomach ulcers, severe kidney disease, &/or actively taking a blood thinner  What if I still have pain? If you have pain that is not controlled with the over-the-counter pain medications (Tylenol and Motrin or Advil) you might have what we call "breakthrough" pain. You will receive a prescription for a small amount of an opioid pain medication such as Oxycodone, Tramadol, or Tylenol with Codeine. Use these opioid pills in the first 24 hours after surgery if you have breakthrough pain. Do not take more than 1 pill every 4-6 hours.  If you still have uncontrolled pain after using all opioid pills, don't hesitate to call our staff using the number provided. We will help make sure you are managing your pain in the best way possible, and if necessary, we can provide a prescription for additional pain medication.   Day 1    Time  Name of Medication Number of pills taken  Amount of Acetaminophen  Pain Level   Comments  AM PM       AM PM       AM PM         AM PM       AM PM       AM PM       AM PM       AM PM       Total Daily amount of Acetaminophen Do not take more than  3,000 mg per day      Day 2    Time  Name of Medication Number of pills taken  Amount of Acetaminophen  Pain Level   Comments  AM PM       AM PM       AM PM       AM PM       AM PM       AM PM       AM PM       AM PM       Total Daily amount of Acetaminophen Do not take more than  3,000 mg per day      Day 3    Time  Name of Medication Number of pills taken  Amount of Acetaminophen  Pain Level   Comments  AM PM       AM PM       AM PM       AM PM          AM PM       AM PM       AM PM       AM PM       Total Daily amount of Acetaminophen Do not take  more than  3,000 mg per day      Day 4    Time  Name of Medication Number of pills taken  Amount of Acetaminophen  Pain Level   Comments  AM PM       AM PM       AM PM       AM PM       AM PM       AM PM       AM PM       AM PM       Total Daily amount of Acetaminophen Do not take more than  3,000 mg per day      Day 5    Time  Name of Medication Number of pills taken  Amount of Acetaminophen  Pain Level   Comments  AM PM       AM PM       AM PM       AM PM       AM PM       AM PM       AM PM       AM PM       Total Daily amount of Acetaminophen Do not take more than  3,000 mg per day       Day 6    Time  Name of Medication Number of pills taken  Amount of Acetaminophen  Pain Level  Comments  AM PM       AM PM       AM PM       AM PM       AM PM       AM PM       AM PM       AM PM       Total Daily amount of Acetaminophen Do not take more than  3,000 mg per day      Day 7    Time  Name of Medication Number of pills taken  Amount of Acetaminophen  Pain Level   Comments  AM PM       AM PM       AM PM       AM PM       AM PM       AM PM       AM PM       AM PM       Total Daily amount of Acetaminophen Do not take more than  3,000 mg per day        For additional information about how and where to safely dispose of unused opioid medications - [employer]  Disclaimer: This document contains information and/or instructional materials adapted from [facility] for the typical patient with your condition. It does not replace medical advice from your health care provider because your experience may differ from that of the typical patient. Talk to your health care provider if you have any questions about this document, your condition or your treatment plan. Adapted from [facility] Instructions  Activity: Get plenty of rest for the remainder of the day. A responsible  individual must stay with you for 24 hours following the procedure.  For the next 24 hours, DO NOT: -Drive a car -Paediatric nurse -Drink alcoholic beverages -Take any medication unless instructed by your physician -Make any legal decisions or sign important papers.  Meals: Start with liquid foods such as gelatin or soup. Progress to regular foods as tolerated. Avoid greasy, spicy, heavy foods. If nausea and/or vomiting occur, drink only clear liquids until the nausea and/or vomiting subsides. Call your physician if vomiting continues.  Special Instructions/Symptoms: Your throat may feel dry or sore from the anesthesia or the breathing tube placed in your throat during surgery. If this causes discomfort, gargle with warm salt water. The discomfort should disappear within 24 hours.  No acetaminophen/Tylenol until after 2:50 pm today if needed.   No ibuprofen, Advil, Aleve, Motrin, ketorolac, meloxicam, naproxen, or other NSAIDS until after 2:50 pm today if needed.

## 2022-02-27 NOTE — Anesthesia Procedure Notes (Signed)
Procedure Name: LMA Insertion Date/Time: 02/27/2022 10:23 AM  Performed by: Rogers Blocker, CRNAPre-anesthesia Checklist: Patient identified, Emergency Drugs available, Suction available and Patient being monitored Patient Re-evaluated:Patient Re-evaluated prior to induction Oxygen Delivery Method: Circle System Utilized Preoxygenation: Pre-oxygenation with 100% oxygen Induction Type: IV induction Ventilation: Mask ventilation without difficulty LMA: LMA inserted LMA Size: 5.0 Number of attempts: 1 Placement Confirmation: positive ETCO2 Tube secured with: Tape Dental Injury: Teeth and Oropharynx as per pre-operative assessment

## 2022-02-27 NOTE — Transfer of Care (Signed)
Immediate Anesthesia Transfer of Care Note  Patient: Vollie Brunty  Procedure(s) Performed: EXCISION LIPOMA LEFT CHEST (Left: Chest)  Patient Location: PACU  Anesthesia Type:General  Level of Consciousness: drowsy, patient cooperative and responds to stimulation  Airway & Oxygen Therapy: Patient Spontanous Breathing  Post-op Assessment: Report given to RN and Post -op Vital signs reviewed and stable  Post vital signs: Reviewed and stable  Last Vitals:  Vitals Value Taken Time  BP 104/67 02/27/22 1120  Temp    Pulse 80 02/27/22 1122  Resp 13 02/27/22 1122  SpO2 94 % 02/27/22 1122  Vitals shown include unvalidated device data.  Last Pain:  Vitals:   02/27/22 0847  TempSrc: Oral  PainSc: 1       Patients Stated Pain Goal: 1 ([id number])  Complications: No notable events documented.

## 2022-02-28 ENCOUNTER — Encounter (HOSPITAL_BASED_OUTPATIENT_CLINIC_OR_DEPARTMENT_OTHER): Payer: Self-pay | Admitting: General Surgery

## 2022-02-28 LAB — SURGICAL PATHOLOGY

## 2022-02-28 NOTE — Anesthesia Postprocedure Evaluation (Signed)
Anesthesia Post Note  Patient: Todd Pham  Procedure(s) Performed: EXCISION LIPOMA LEFT CHEST (Left: Chest)     Patient location during evaluation: PACU Anesthesia Type: General Level of consciousness: awake and alert Pain management: pain level controlled Vital Signs Assessment: post-procedure vital signs reviewed and stable Respiratory status: spontaneous breathing, nonlabored ventilation, respiratory function stable and patient connected to nasal cannula oxygen Cardiovascular status: blood pressure returned to baseline and stable Postop Assessment: no apparent nausea or vomiting Anesthetic complications: no   No notable events documented.  Last Vitals:  Vitals:   02/27/22 1200 02/27/22 1241  BP: 99/70 114/74  Pulse: 68 65  Resp: 11 14  Temp:  36.8 C  SpO2: 95% 98%    Last Pain:  Vitals:   02/27/22 1241  TempSrc:   PainSc: 0-No pain                 [facility]

## 2024-03-04 ENCOUNTER — Encounter: Payer: Self-pay | Admitting: Advanced Practice Midwife
